# Patient Record
Sex: Female | Born: 1935 | Race: White | Hispanic: No | Marital: Single | Smoking: Former smoker
Health system: Southern US, Community
[De-identification: ages and names within clinical notes are randomized; demographics above are authoritative.]

## PROBLEM LIST (undated history)

## (undated) DIAGNOSIS — F32A Depression, unspecified: Secondary | ICD-10-CM

## (undated) DIAGNOSIS — I10 Essential (primary) hypertension: Secondary | ICD-10-CM

## (undated) DIAGNOSIS — M810 Age-related osteoporosis without current pathological fracture: Secondary | ICD-10-CM

## (undated) DIAGNOSIS — I679 Cerebrovascular disease, unspecified: Secondary | ICD-10-CM

## (undated) DIAGNOSIS — E785 Hyperlipidemia, unspecified: Secondary | ICD-10-CM

## (undated) DIAGNOSIS — E669 Obesity, unspecified: Secondary | ICD-10-CM

## (undated) DIAGNOSIS — Z0000 Encounter for general adult medical examination without abnormal findings: Secondary | ICD-10-CM

## (undated) DIAGNOSIS — F329 Major depressive disorder, single episode, unspecified: Secondary | ICD-10-CM

## (undated) DIAGNOSIS — I639 Cerebral infarction, unspecified: Secondary | ICD-10-CM

## (undated) DIAGNOSIS — B029 Zoster without complications: Secondary | ICD-10-CM

## (undated) DIAGNOSIS — J439 Emphysema, unspecified: Secondary | ICD-10-CM

## (undated) DIAGNOSIS — J449 Chronic obstructive pulmonary disease, unspecified: Secondary | ICD-10-CM

## (undated) HISTORY — DX: Age-related osteoporosis without current pathological fracture: M81.0

## (undated) HISTORY — DX: Encounter for general adult medical examination without abnormal findings: Z00.00

## (undated) HISTORY — DX: Zoster without complications: B02.9

## (undated) HISTORY — DX: Major depressive disorder, single episode, unspecified: F32.9

## (undated) HISTORY — DX: Hyperlipidemia, unspecified: E78.5

## (undated) HISTORY — DX: Cerebrovascular disease, unspecified: I67.9

## (undated) HISTORY — PX: TONSILLECTOMY: SUR1361

## (undated) HISTORY — DX: Depression, unspecified: F32.A

## (undated) HISTORY — DX: Emphysema, unspecified: J43.9

## (undated) HISTORY — DX: Obesity, unspecified: E66.9

---

## 2007-09-29 ENCOUNTER — Emergency Department (HOSPITAL_COMMUNITY): Admission: EM | Admit: 2007-09-29 | Discharge: 2007-09-29 | Payer: Self-pay | Admitting: *Deleted

## 2009-03-18 ENCOUNTER — Ambulatory Visit: Payer: Self-pay | Admitting: Vascular Surgery

## 2009-10-24 ENCOUNTER — Encounter: Payer: Self-pay | Admitting: Cardiovascular Disease

## 2009-12-01 ENCOUNTER — Encounter: Payer: Self-pay | Admitting: Cardiovascular Disease

## 2009-12-02 DIAGNOSIS — R0989 Other specified symptoms and signs involving the circulatory and respiratory systems: Secondary | ICD-10-CM | POA: Insufficient documentation

## 2009-12-02 DIAGNOSIS — R0789 Other chest pain: Secondary | ICD-10-CM | POA: Insufficient documentation

## 2009-12-02 DIAGNOSIS — I10 Essential (primary) hypertension: Secondary | ICD-10-CM | POA: Insufficient documentation

## 2009-12-02 DIAGNOSIS — M810 Age-related osteoporosis without current pathological fracture: Secondary | ICD-10-CM | POA: Insufficient documentation

## 2009-12-02 DIAGNOSIS — Z87891 Personal history of nicotine dependence: Secondary | ICD-10-CM | POA: Insufficient documentation

## 2009-12-02 DIAGNOSIS — I679 Cerebrovascular disease, unspecified: Secondary | ICD-10-CM | POA: Insufficient documentation

## 2009-12-02 DIAGNOSIS — R0609 Other forms of dyspnea: Secondary | ICD-10-CM

## 2009-12-02 DIAGNOSIS — J438 Other emphysema: Secondary | ICD-10-CM | POA: Insufficient documentation

## 2009-12-02 DIAGNOSIS — I2699 Other pulmonary embolism without acute cor pulmonale: Secondary | ICD-10-CM | POA: Insufficient documentation

## 2009-12-02 DIAGNOSIS — E559 Vitamin D deficiency, unspecified: Secondary | ICD-10-CM | POA: Insufficient documentation

## 2009-12-05 ENCOUNTER — Ambulatory Visit: Payer: Self-pay | Admitting: Cardiovascular Disease

## 2009-12-05 DIAGNOSIS — R072 Precordial pain: Secondary | ICD-10-CM | POA: Insufficient documentation

## 2009-12-27 ENCOUNTER — Encounter: Payer: Self-pay | Admitting: Cardiovascular Disease

## 2010-01-10 ENCOUNTER — Ambulatory Visit (HOSPITAL_COMMUNITY): Admission: RE | Admit: 2010-01-10 | Discharge: 2010-01-10 | Payer: Self-pay | Admitting: Cardiovascular Disease

## 2010-01-10 ENCOUNTER — Ambulatory Visit: Payer: Self-pay

## 2010-01-10 ENCOUNTER — Encounter: Payer: Self-pay | Admitting: Cardiovascular Disease

## 2010-01-10 ENCOUNTER — Ambulatory Visit: Payer: Self-pay | Admitting: Cardiology

## 2010-01-19 ENCOUNTER — Ambulatory Visit: Payer: Self-pay | Admitting: Cardiovascular Disease

## 2010-11-07 NOTE — Assessment & Plan Note (Signed)
Summary: f3w/jss   Visit Type:  3 weeks follow up Primary Provider:  Jarome Matin  CC:  BP was very high when she had the stress test.  History of Present Illness: 77 yo WF with history of long term tobacco abuse (stopped 1/11) , HTN, COPD here today for cardiac follow up. She began to notice chest tightness 2 months ago at the time of an upper respiratory infection. She describes a band-like tightness around her chest. No associated SOB, diaphoresis, nausea or vomiting. Occurs mostly when sitting around. Only lasts for a minute or two. She has been able to do yardwork and housework without any chest pressure. This seems to have resolved since her URI was treated. She feels back at her normal state of health. She does describe overall lack of energy and less exercise tolerance over the last few months. No near syncope or syncope.   She underwent a stress echo and this showed no ischemia. She did have a hypertensive response with exercise. She has no new complaints. No chest pain since she had her upper respiratory infection.   Current Medications (verified): 1)  Atenolol 50 Mg Tabs (Atenolol) .Marland Kitchen.. 1 Tab Once Daily..take 1 Extra Tab If Bp Elevated 2)  Diovan Hct 160-12.5 Mg Tabs (Valsartan-Hydrochlorothiazide) .Marland Kitchen.. 1 Tab Once Daily 3)  Norvasc 5 Mg Tabs (Amlodipine Besylate) .Marland Kitchen.. 1 Tab M, W, F 4)  Aspirin Ec 325 Mg Tbec (Aspirin) .... Take One Tablet By Mouth Daily 5)  Multivitamins   Tabs (Multiple Vitamin) .Marland Kitchen.. 1 Tab Once Daily 6)  Oscal 500/200 D-3 500-200 Mg-Unit Tabs (Calcium-Vitamin D) .Marland Kitchen.. 1 Tab Once Daily 7)  Plavix 75 Mg Tabs (Clopidogrel Bisulfate) .Marland Kitchen.. 1 Tab Once Daily 8)  Spiriva Handihaler 18 Mcg Caps (Tiotropium Bromide Monohydrate) .Marland Kitchen.. 1 Puff Once Daily 9)  Ventolin Hfa 108 (90 Base) Mcg/act Aers (Albuterol Sulfate) .... As Needed  Allergies (verified): No Known Drug Allergies  Past History:  Past Medical History: Reviewed history from 12/05/2009 and no changes  required. COPD, recent diagnosis 1/11. ESSENTIAL HYPERTENSION, BENIGN TOBACCO USE, former CVA 2007 OSTEOPOROSIS  VITAMIN D DEFICIENCY PE 1970 on Birth control pills.    Social History: Reviewed history from 12/05/2009 and no changes required. 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  1ppd for 50 years, quit Jan 2011. Alcohol Use -No Drug Use - No  Review of Systems  The patient denies fatigue, malaise, fever, weight gain/loss, vision loss, decreased hearing, hoarseness, chest pain, palpitations, shortness of breath, prolonged cough, wheezing, sleep apnea, coughing up blood, abdominal pain, blood in stool, nausea, vomiting, diarrhea, heartburn, incontinence, blood in urine, muscle weakness, joint pain, leg swelling, rash, skin lesions, headache, fainting, dizziness, depression, anxiety, enlarged lymph nodes, easy bruising or bleeding, and environmental allergies.    Vital Signs:  Patient profile:   77 year old female Height:      65 inches Weight:      156.50 pounds BMI:     26.14 Pulse rate:   60 / minute Pulse rhythm:   regular Resp:     18 per minute BP sitting:   160 / 90  (left arm) Cuff size:   large  Vitals Entered By: Vikki Ports (January 19, 2010 11:01 AM)  Physical Exam  General:  General: Well developed, well nourished, NAD HEENT: OP clear, mucus membranes moist Psychiatric: Mood and affect normal Neck: No JVD, no carotid bruits, no thyromegaly, no lymphadenopathy. Lungs:Clear bilaterally, no wheezes, rhonci, crackles CV: RRR  no murmurs, gallops rubs Abdomen: soft, NT, ND, BS present Extremities: No edema, pulses 2+.    Stress Echocardiogram  Procedure date:  01/10/2010  Findings:      Study Conclusions  - Stress ECG conclusions: There were no stress arrhythmias or   conduction abnormalities. The stress ECG was normal. - Staged echo: There was no echocardiographic evidence for   stress-induced  ischemia. Recommendations: Stress echocardiogram with no chest pain, no new ST changes and no stress-induced wall motion abnormalities. Note patient with poor exercise tolerance and hypertensive response.   +---------------------+---+-------------+-----------------+ Stage 1              [id number] (144)Fatigue, leg pain +---------------------+---+-------------+-----------------+ Immediate post stress125-------------None              +---------------------+---+-------------+-----------------+ Recovery; 1 min      92 198/80 (119) None              +---------------------+---+-------------+-----------------+ Recovery; 2 min      84 -------------None              +---------------------+---+-------------+-----------------+ Recovery; 3 min      82 -------------None              +-------------------  Impression & Recommendations:  Problem # 1:  CHEST PAIN, ATYPICAL (ICD-786.59) Most likely non-cardiac in etiology. Her stress echo was normal. She did have a hypertensive response to exercise. Her resting blood pressure is elevated today. I have discussed changing her medications by increasing her Norvasc to once daily but she does not wish to do this. She has a record of blood pressures from home and they are mostly less than 150 systolic. She will discuss with Dr. Eloise Harman at her follow up appt.  No further cardiac workup at this time.   Her updated medication list for this problem includes:    Atenolol 50 Mg Tabs (Atenolol) .Marland Kitchen... 1 tab once daily..take 1 extra tab if bp elevated    Norvasc 5 Mg Tabs (Amlodipine besylate) .Marland Kitchen... 1 tab m, w, f    Aspirin Ec 325 Mg Tbec (Aspirin) .Marland Kitchen... Take one tablet by mouth daily    Plavix 75 Mg Tabs (Clopidogrel bisulfate) .Marland Kitchen... 1 tab once daily  Patient Instructions: 1)  Your physician recommends that you schedule a follow-up appointment as needed.

## 2010-11-07 NOTE — Letter (Signed)
Summary: [facility] Office Note  [facility] Office Note   Imported By: Roderic Ovens 12/16/2009 16:33:09  _____________________________________________________________________  External Attachment:    Type:   Image     Comment:   External Document

## 2010-11-07 NOTE — Letter (Signed)
Summary: [facility] - Med List  [facility]   Imported By: Marylou Mccoy 01/19/2010 14:12:57  _____________________________________________________________________  External Attachment:    Type:   Image     Comment:   External Document

## 2010-11-07 NOTE — Assessment & Plan Note (Signed)
Summary: NP6/CHEST PAIN/JML   Visit Type:  new pt visit  CC:  occassional cp/pressure.  History of Present Illness: 77 yo WF with history of long term tobacco abuse, HTN, COPD here today for an initial cardiac evaluation. She began to notice chest tightness 2 months ago at the time of an upper respiratory infection. She describes a bank-like tightness around her chest. No associated SOB, diaphoresis, nausea or vomiting. Occurs mostly when sitting around. Only lasts for a minute or two. She has been able to do yardwork and housework without any chest pressure. This seems to have resolved since her URI was treated. She feels back at her normal state of health. She does describe overall lack of energy and less exercise tolerance over the last few months. No near syncope or syncope.   Current Medications (verified): 1)  Atenolol 50 Mg Tabs (Atenolol) .Marland Kitchen.. 1 Tab Once Daily..take 1 Extra Tab If Bp Elevated 2)  Diovan Hct 160-12.5 Mg Tabs (Valsartan-Hydrochlorothiazide) .Marland Kitchen.. 1 Tab Once Daily 3)  Norvasc 5 Mg Tabs (Amlodipine Besylate) .Marland Kitchen.. 1 Tab M, W, F 4)  Aspirin Ec 325 Mg Tbec (Aspirin) .... Take One Tablet By Mouth Daily 5)  Multivitamins   Tabs (Multiple Vitamin) .Marland Kitchen.. 1 Tab Once Daily 6)  Oscal 500/200 D-3 500-200 Mg-Unit Tabs (Calcium-Vitamin D) .Marland Kitchen.. 1 Tab Once Daily 7)  Plavix 75 Mg Tabs (Clopidogrel Bisulfate) .Marland Kitchen.. 1 Tab Once Daily 8)  Spiriva Handihaler 18 Mcg Caps (Tiotropium Bromide Monohydrate) .Marland Kitchen.. 1 Puff Once Daily 9)  Ventolin Hfa 108 (90 Base) Mcg/act Aers (Albuterol Sulfate) .... As Needed  Allergies (verified): No Known Drug Allergies  Past History:  Past Medical History: COPD, recent diagnosis 1/11. ESSENTIAL HYPERTENSION, BENIGN TOBACCO USE, former CVA 2007 OSTEOPOROSIS  VITAMIN D DEFICIENCY PE 1970 on Birth control pills.    Family History: Father died age 70, cancer Mother died age 29 with cancer, CVA No CAD  Social History: Therapist, nutritional Divorced , 1 child Tobacco Use - Former.  1ppd for 50 years, quit Jan 2011. Alcohol Use -No Drug Use - No  Review of Systems       The patient complains of fatigue and chest pain.  The patient denies malaise, fever, weight gain/loss, vision loss, decreased hearing, hoarseness, palpitations, shortness of breath, prolonged cough, wheezing, sleep apnea, coughing up blood, abdominal pain, blood in stool, nausea, vomiting, diarrhea, heartburn, incontinence, blood in urine, muscle weakness, joint pain, leg swelling, rash, skin lesions, headache, fainting, dizziness, depression, anxiety, enlarged lymph nodes, easy bruising or bleeding, and environmental allergies.    Vital Signs:  Patient profile:   77 year old female Height:      65 inches Weight:      149 pounds BMI:     24.88 Pulse rate:   63 / minute Pulse rhythm:   irregular BP sitting:   165 / 89  (left arm)  Vitals Entered By: Danielle Rankin, CMA (December 05, 2009 9:17 AM)  Physical Exam  General:  General: Well developed, well nourished, NAD HEENT: OP clear, mucus membranes moist SKIN: warm, dry Neuro: No focal deficits Musculoskeletal: Muscle strength 5/5 all ext Psychiatric: Mood and affect normal Neck: No JVD, no carotid bruits, no thyromegaly, no lymphadenopathy. Lungs:Clear bilaterally, no wheezes, rhonci, crackles CV: RRR no murmurs, gallops rubs Abdomen: soft, NT, ND, BS present Extremities: No edema, pulses 2+.    EKG  Procedure date:  12/05/2009  Findings:      NSR, rate of 63 bpm. Poor  R wave progression through the precordial leads.  Impression & Recommendations:  Problem # 1:  CHEST TIGHTNESS-PRESSURE-OTHER ([id number]) It is most likely that her chest pressure is related to her recent URI with underlying COPD. She does have several risk factors for CAD including long term tobacco abuse and HTN. Her EKG shows poor R wave progression through the precordial leads. Although her symptoms are mostly  atypical, I think it would be reasonable to proceed with an exercise stress echo to risk stratify. I will see her back to review in several weeks.   Other Orders: EKG w/ Interpretation (93000) Stress Echo (Stress Echo)  Patient Instructions: 1)  Your physician recommends that you schedule a follow-up appointment in: 3 weeks 2)  Your physician has requested that you have a stress echocardiogram. For further information please visit [email or web address]  Please follow instruction sheet as given.

## 2010-11-07 NOTE — Consult Note (Signed)
Summary: [facility]   Imported By: Marylou Mccoy 01/19/2010 14:26:42  _____________________________________________________________________  External Attachment:    Type:   Image     Comment:   External Document

## 2010-11-12 ENCOUNTER — Inpatient Hospital Stay (INDEPENDENT_AMBULATORY_CARE_PROVIDER_SITE_OTHER)
Admission: RE | Admit: 2010-11-12 | Discharge: 2010-11-12 | Disposition: A | Discharge status: Home / Self Care | Payer: Medicare Other | Source: Ambulatory Visit | Attending: Family Medicine | Admitting: Family Medicine

## 2010-11-12 ENCOUNTER — Emergency Department (HOSPITAL_COMMUNITY): Payer: Medicare Other

## 2010-11-12 ENCOUNTER — Emergency Department (HOSPITAL_COMMUNITY)
Admission: EM | Admit: 2010-11-12 | Discharge: 2010-11-13 | Disposition: A | Discharge status: Home / Self Care | Payer: Medicare Other | Attending: Emergency Medicine | Admitting: Emergency Medicine

## 2010-11-12 DIAGNOSIS — R42 Dizziness and giddiness: Secondary | ICD-10-CM | POA: Insufficient documentation

## 2010-11-12 DIAGNOSIS — I10 Essential (primary) hypertension: Secondary | ICD-10-CM

## 2010-11-12 DIAGNOSIS — Z8679 Personal history of other diseases of the circulatory system: Secondary | ICD-10-CM | POA: Insufficient documentation

## 2010-11-12 DIAGNOSIS — G9389 Other specified disorders of brain: Secondary | ICD-10-CM | POA: Insufficient documentation

## 2010-11-12 DIAGNOSIS — J438 Other emphysema: Secondary | ICD-10-CM | POA: Insufficient documentation

## 2010-11-12 LAB — CBC
HCT: 38.2 % (ref 36.0–46.0)
Hemoglobin: 12.8 g/dL (ref 12.0–15.0)
MCH: 30.4 pg (ref 26.0–34.0)
MCV: 90.7 fL (ref 78.0–100.0)
Platelets: 299 10*3/uL (ref 150–400)
RBC: 4.21 MIL/uL (ref 3.87–5.11)

## 2010-11-12 LAB — COMPREHENSIVE METABOLIC PANEL
AST: 27 U/L (ref 0–37)
Alkaline Phosphatase: 59 U/L (ref 39–117)
BUN: 11 mg/dL (ref 6–23)
CO2: 26 mEq/L (ref 19–32)
Calcium: 9.6 mg/dL (ref 8.4–10.5)
Creatinine, Ser: 0.75 mg/dL (ref 0.4–1.2)
GFR calc non Af Amer: >60 mL/min (ref >60)
Potassium: 4.4 mEq/L (ref 3.5–5.1)
Total Bilirubin: 0.2 mg/dL — ABNORMAL LOW (ref 0.3–1.2)

## 2010-11-12 LAB — DIFFERENTIAL
Basophils Absolute: 0 10*3/uL (ref 0.0–0.1)
Eosinophils Relative: 1 % (ref 0–5)
Lymphocytes Relative: 19 % (ref 12–46)
Lymphs Abs: 1.7 10*3/uL (ref 0.7–4.0)
Monocytes Relative: 8 % (ref 3–12)
Neutrophils Relative %: 72 % (ref 43–77)

## 2010-11-12 LAB — URINALYSIS, ROUTINE W REFLEX MICROSCOPIC
Ketones, ur: NEGATIVE mg/dL
Urine Glucose, Fasting: NEGATIVE mg/dL
Urobilinogen, UA: 0.2 mg/dL (ref 0.0–1.0)

## 2010-11-12 LAB — URINE MICROSCOPIC-ADD ON

## 2010-11-12 LAB — PROTIME-INR: INR: 0.91 (ref 0.00–1.49)

## 2010-11-12 LAB — APTT: aPTT: 26 seconds (ref 24–37)

## 2010-11-14 LAB — URINE CULTURE

## 2011-02-14 ENCOUNTER — Encounter (HOSPITAL_COMMUNITY): Payer: Self-pay | Admitting: Radiology

## 2011-02-14 ENCOUNTER — Emergency Department (HOSPITAL_COMMUNITY)
Admission: EM | Admit: 2011-02-14 | Discharge: 2011-02-14 | Disposition: A | Discharge status: Home / Self Care | Payer: Medicare Other | Attending: Emergency Medicine | Admitting: Emergency Medicine

## 2011-02-14 ENCOUNTER — Emergency Department (HOSPITAL_COMMUNITY): Payer: Medicare Other

## 2011-02-14 ENCOUNTER — Inpatient Hospital Stay (HOSPITAL_COMMUNITY)
Admission: EM | Admit: 2011-02-14 | Discharge: 2011-02-15 | DRG: 069 | Disposition: A | Discharge status: Home / Self Care | Payer: Medicare Other | Attending: Internal Medicine | Admitting: Internal Medicine

## 2011-02-14 DIAGNOSIS — R079 Chest pain, unspecified: Secondary | ICD-10-CM | POA: Insufficient documentation

## 2011-02-14 DIAGNOSIS — E559 Vitamin D deficiency, unspecified: Secondary | ICD-10-CM | POA: Diagnosis present

## 2011-02-14 DIAGNOSIS — J438 Other emphysema: Secondary | ICD-10-CM | POA: Diagnosis present

## 2011-02-14 DIAGNOSIS — I658 Occlusion and stenosis of other precerebral arteries: Secondary | ICD-10-CM | POA: Insufficient documentation

## 2011-02-14 DIAGNOSIS — Z7982 Long term (current) use of aspirin: Secondary | ICD-10-CM

## 2011-02-14 DIAGNOSIS — E785 Hyperlipidemia, unspecified: Secondary | ICD-10-CM | POA: Diagnosis present

## 2011-02-14 DIAGNOSIS — Z86711 Personal history of pulmonary embolism: Secondary | ICD-10-CM

## 2011-02-14 DIAGNOSIS — I672 Cerebral atherosclerosis: Secondary | ICD-10-CM | POA: Insufficient documentation

## 2011-02-14 DIAGNOSIS — I6529 Occlusion and stenosis of unspecified carotid artery: Secondary | ICD-10-CM | POA: Insufficient documentation

## 2011-02-14 DIAGNOSIS — Z8679 Personal history of other diseases of the circulatory system: Secondary | ICD-10-CM | POA: Insufficient documentation

## 2011-02-14 DIAGNOSIS — Z7902 Long term (current) use of antithrombotics/antiplatelets: Secondary | ICD-10-CM

## 2011-02-14 DIAGNOSIS — Z87891 Personal history of nicotine dependence: Secondary | ICD-10-CM

## 2011-02-14 DIAGNOSIS — Z8673 Personal history of transient ischemic attack (TIA), and cerebral infarction without residual deficits: Secondary | ICD-10-CM

## 2011-02-14 DIAGNOSIS — M810 Age-related osteoporosis without current pathological fracture: Secondary | ICD-10-CM | POA: Diagnosis present

## 2011-02-14 DIAGNOSIS — G458 Other transient cerebral ischemic attacks and related syndromes: Principal | ICD-10-CM | POA: Diagnosis present

## 2011-02-14 DIAGNOSIS — I10 Essential (primary) hypertension: Secondary | ICD-10-CM | POA: Diagnosis present

## 2011-02-14 DIAGNOSIS — R4789 Other speech disturbances: Secondary | ICD-10-CM | POA: Insufficient documentation

## 2011-02-14 DIAGNOSIS — G459 Transient cerebral ischemic attack, unspecified: Secondary | ICD-10-CM | POA: Insufficient documentation

## 2011-02-14 HISTORY — DX: Essential (primary) hypertension: I10

## 2011-02-14 LAB — COMPREHENSIVE METABOLIC PANEL
Albumin: 3.7 g/dL (ref 3.5–5.2)
Alkaline Phosphatase: 67 U/L (ref 39–117)
BUN: 11 mg/dL (ref 6–23)
Chloride: 100 mEq/L (ref 96–112)
Creatinine, Ser: 0.6 mg/dL (ref 0.4–1.2)
GFR calc non Af Amer: >60 mL/min (ref >60)
Total Bilirubin: 0.3 mg/dL (ref 0.3–1.2)
Total Protein: 7.3 g/dL (ref 6.0–8.3)

## 2011-02-14 LAB — POCT I-STAT 3, VENOUS BLOOD GAS (G3P V)
Acid-Base Excess: 4 mmol/L — ABNORMAL HIGH (ref 0.0–2.0)
Bicarbonate: 31 mEq/L — ABNORMAL HIGH (ref 20.0–24.0)
pCO2, Ven: 54.2 mmHg — ABNORMAL HIGH (ref 45.0–50.0)
pH, Ven: 7.366 — ABNORMAL HIGH (ref 7.250–7.300)
pO2, Ven: 15 mmHg — CL (ref 30.0–45.0)

## 2011-02-14 LAB — URINALYSIS, ROUTINE W REFLEX MICROSCOPIC
Bilirubin Urine: NEGATIVE
Glucose, UA: NEGATIVE mg/dL
Hgb urine dipstick: NEGATIVE
Ketones, ur: NEGATIVE mg/dL
Nitrite: NEGATIVE
Specific Gravity, Urine: 1.013 (ref 1.005–1.030)

## 2011-02-14 LAB — CBC
HCT: 37.7 % (ref 36.0–46.0)
MCHC: 35 g/dL (ref 30.0–36.0)
MCV: 89.3 fL (ref 78.0–100.0)
RDW: 13.2 % (ref 11.5–15.5)
WBC: 6.6 10*3/uL (ref 4.0–10.5)

## 2011-02-14 LAB — DIFFERENTIAL
Eosinophils Absolute: 0.1 10*3/uL (ref 0.0–0.7)
Lymphocytes Relative: 22 % (ref 12–46)
Lymphs Abs: 1.5 10*3/uL (ref 0.7–4.0)
Neutro Abs: 4.4 10*3/uL (ref 1.7–7.7)

## 2011-02-14 LAB — URINE MICROSCOPIC-ADD ON

## 2011-02-14 LAB — POCT CARDIAC MARKERS

## 2011-02-14 LAB — LIPID PANEL
Cholesterol: 168 mg/dL (ref 0–200)
HDL: 61 mg/dL (ref >39)
Total CHOL/HDL Ratio: 2.8 RATIO
Triglycerides: 90 mg/dL (ref <150)
VLDL: 18 mg/dL (ref 0–40)

## 2011-02-14 LAB — PROTIME-INR
INR: 0.95 (ref 0.00–1.49)
Prothrombin Time: 12.9 seconds (ref 11.6–15.2)

## 2011-02-14 LAB — APTT: aPTT: 27 seconds (ref 24–37)

## 2011-02-15 NOTE — Consult Note (Signed)
NAMECHLOIE, LONEY NO.:  [id number]  MEDICAL RECORD NO.:  [id number]           PATIENT TYPE:  E  LOCATION:  [facility]                         FACILITY:  [facility]  PHYSICIAN:  Thana Farr, MD    DATE OF BIRTH:  [birth year]  DATE OF CONSULTATION: DATE OF DISCHARGE:                                CONSULTATION   CHIEF COMPLAINT:  Generalized weakness associated with seeing squiggly lines and difficulty with speaking.  HISTORY OF PRESENT ILLNESS:  The patient is a 77 year old female who presents to [facility] ED with main concern of seeing squiggly lines associated with generalized weakness.  This started last night.  The patient checked her blood pressure at that time and was 205/130.  She took blood pressure medications, went to bed, and woke up around 4 a.m. At that time, her blood pressure again was elevated over 180/100 and she was seeing squiggly lines again.  Once she took blood pressure medications, her blood pressure was normalized and her vision resolved. She went to ER because she was concerned for possible stroke given her history of stroke 6 years ago and friend that lives with her who recently had a stroke as well.  The patient was discharged from ER and came back because she had an episode of recurrent slurred speech.  The patient reports feeling better at this time.  Also reports her slurred speech and blurry vision is resolved at this point.  PAST MEDICAL HISTORY:  Stroke 6 years ago, hypertension and emphysema.  MEDICATIONS:  Aspirin, atenolol, Centrum and Diovan.  ALLERGIES:  NKDA.  FAMILY HISTORY:  Noncontributory.  SOCIAL HISTORY:  Lives at home in [place] alone.  No alcohol, tobacco or drug use.  REVIEW OF SYSTEMS:  Generalized weakness associated with slurred speech, otherwise negative.  PHYSICAL EXAMINATION:  VITAL SIGNS:  Temperature 98.7, blood pressure 213/76, heart rate 50-65, respirations 12, oxygen saturation 99. HEENT:   Atraumatic, PERRLA. NECK:  Supple.  Full range of motion. LUNGS:  Clear to auscultation bilaterally.  No wheezing. CARDIOVASCULAR:  Bradycardiac, sinus rhythm, distant heart sounds, S1, S2 present.  No murmurs appreciated. ABDOMEN:  Soft, nontender, nondistended.  Bowel sounds present. SKIN:  Mucosa moist with good turgor. NEURO:  Mental Status-The patient is alert and oriented x3. Speech is fluent without aphasia.  Follow commands withoult difficulty.  Cranial Nerves-II: Discs flat bilaterally.  Visual fields full. III/IV/VI: PERRLA, EOMI without nystagmus. V/VII:  Decreased right NLF. VIII: grossly intact IX/X: positive gag XII: midline tongue extension Motor: strength is 5/5 throughout. Normal tone and bulk.  Sensation: intact. Cerebellar: normal heel-to-shin and finger-to-nose bilaterally. DTR +2 throughout.  Plantars mute bilaterally.  TEST RESULTS:  Sodium 136, potassium 3.6, chloride 100, bicarb 27, BUN 11, creatinine 0.60, glucose 99.  WBC 6.6, hemoglobin 13.2, hematocrit 37.7, platelets 310.  PT/INR, PTT pending.  Urinalysis negative except trace leukocytes.  CT negative for acute infarction.  MRI negative for acute infarction.  ASSESSMENT:  The patient is a 77 year old female who presents with visual changes and speech deficits felt to be secondary to high blood pressure. Patient with risk factors  of hypertension.  The patient has CT and MRI negative for acute stroke.  Symptoms have now resolved therefore she is not a tPA candidate.  Although she has had a good portion of the stroke work up this will need to be completed and she will need observation for continuing symptoms.  PLAN: 1. CTA of the neck to rule out large vessel occlusion. 2. 2D echo  3. Fasting, lipid profile and hemoglobin A1c. 4. Continue Plavix and aspirin    If above workup is negative, no further neurologic workup is recommended.    Over 30 minutes was spent on consulting the patient.           ______________________________ Thana Farr, MD     LR/MEDQ  D:  02/14/2011  T:  02/14/2011  Job:  [id number]  Electronically Signed by Thana Farr MD on 02/15/2011 08:46:39 AM

## 2011-02-20 NOTE — H&P (Signed)
[name], Graves NO.:  [id number]  MEDICAL RECORD NO.:  [id number]           PATIENT TYPE:  I  LOCATION:  3033                         FACILITY:  [facility]  PHYSICIAN:  Barry Dienes. Eloise Harman, M.D.DATE OF BIRTH:  16-Aug-1933  DATE OF ADMISSION:  02/14/2011 DATE OF DISCHARGE:                             HISTORY & PHYSICAL   CHIEF COMPLAINT:  Transient speech difficulties and vision changes.  HISTORY OF PRESENT ILLNESS:  The patient is a 77 year old Caucasian woman with several medical problems who was in her usual state of reasonable health until yesterday when she started having intermittent lines in the vision on both sides.  She checked her home blood pressure which was greater than 200 systolic and took an extra dose of atenolol. Her symptoms resolved initially.  Today at approximately 4:15 a.m. when she awoke, she continued to have squiggly lines in both sides of her visual field and checked her blood pressure, which was 179/100.  She presented to the emergency room for evaluation.  In the emergency room, she had a full evaluation including labs, EKG, chest x-ray, CT scan of the head without contrast, MRI of the brain, and MRA of the brain.  The test did not show an acute infarct and her symptoms resolved within a short period of time.  When she presented to the emergency room, she had also had some slurred speech and dysarthria with word-finding difficulty.  She was discharged from the emergency room at approximately 1:30 p.m. with a plan for a follow up with a neurologist within the next few days.  Unfortunately, she developed dysarthria within 30 minutes of the emergency room discharge and was readmitted for evaluation.  She had dysarthria with some word-finding difficulty that lasted about 10 minutes and was readmitted for evaluation.  She had no other significant symptoms with no facial droop, weakness, headache, vomiting, double vision, fever, or chills,  and she was admitted for further evaluation.  PAST MEDICAL HISTORY:  Bilateral cerebrovascular disease with 2010 carotid ultrasound showing less than 40% stenosis of the internal carotid arteries, hypertension, essential, benign and labile, emphysema with long history of cigarette smoking discontinued in January 2011.  In 1970, she had pulmonary embolism secondary to the use of oral contraceptive pills.  In April 2011, she had atypical chest pain with the stress echocardiogram showing no evidence of ischemia.  In 2011, she had weight gain which was exogenous and associated with stopping smoking.  In February 2012, she had a CT scan that showed an old right caudate nucleus infarct that was asymptomatic.  She also has a history of osteoporosis with 2006 bone mineral density test showing a T score of -2.9 and labs at that time showing borderline vitamin D deficiency.  MEDICATIONS PRIOR TO EMERGENCY ROOM EVALUATION: 1. Aspirin 325 mg once daily. 2. Atenolol 50 mg once daily. 3. Multivitamin one tablet p.o. daily. 4. Os-Cal D 500 once daily. 5. Diovan HCT 160/25 one tablet p.o. q.a.m. and note that she had     stopped Plavix in February 2012 due to its high cost and relatively  low added benefit for strokes and TIAs versus aspirin.  ALLERGIES:  No known drug allergies.  PAST SURGICAL HISTORY:  Remote tonsillectomy.  SOCIAL HISTORY:  She has been divorced for over 30 years.  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  She has a history of tobacco use of one and one half packs per day for many years, but stopped in January 2011.  She has no history of alcohol abuse.  FAMILY HISTORY:  Her father died from lung cancer.  Her mother died at age 63 from stroke.  There is a family history of close relatives with diabetes mellitus and a son with diabetes mellitus, type 1.  REVIEW OF SYSTEMS:  Earlier today, she had blurry vision as described above and currently her vision is  normal.  She has not had recent fever, chills, headaches, shortness of breath, urinary incontinence, arthralgias, weakness, difficulty walking, or dysphagia.  She has occasional very brief twinges of chest discomfort and indigestion and GERD symptoms.  CURRENT PHYSICAL EXAMINATION:  VITAL SIGNS:  Blood pressure 189/79, pulse 56, respirations 17, temperature 98.5, pulse oxygen saturation was 95% on 2 liters per minute of nasal cannula oxygen. GENERAL:  She is an elderly white woman who is in no apparent distress while sitting upright on an emergency room gurney. HEAD, EYES, EARS, NOSE AND THROAT:  Within normal limits and she did not have a facial droop. NECK:  Supple and without jugular venous distention or carotid bruit. CHEST:  Clear to auscultation. HEART:  Had a regular rate and rhythm without murmur or gallop. ABDOMEN:  Had normal bowel sounds and no hepatosplenomegaly or tenderness. EXTREMITIES:  Without cyanosis, clubbing, or edema and the pedal pulses were normal bilaterally. NEUROLOGIC:  She is alert and well oriented with a normal affect. Cranial nerves II through XII were significant for very mildly decreased hearing bilaterally.  Sensory exam was grossly normal, motor strength was 5/5 throughout.  She had normal bilateral finger-to-nose testing. Gait assessment was deferred at this time.  LABORATORY STUDIES:  Serum sodium 136, potassium 3.6, chloride 100, carbon dioxide 27, BUN 11, creatinine 0.60, glucose 99.  Urinalysis was normal.  Liver associated enzymes were normal.  White blood cell count 6.6, hemoglobin 13, hematocrit 37.7, platelets 310.  Total cholesterol was 168, triglycerides 90, HDL cholesterol 61, LDL cholesterol 89.  EKG showed a normal sinus rhythm on telemetry with results of 12-lead EKG study pending at the time of dictation.  MRI of the brain showed no evidence of an acute infarct.  There was left frontoparietal cortical hyperintensity consistent with  a small chronic infarct and negative for hemorrhage, mass, or edema.  The MRA of the brain showed both vertical arteries patent to the basilar artery.  The pica, superior cerebellar, and posterior cerebral arteries were patent bilaterally.  There was moderate disease in the mid and distal right posterior cerebral artery and mild disease in the left posterior cerebral artery.  The carotid arteries were patent bilaterally with mild atherosclerotic disease in the cavernous carotids bilaterally.  There was irregularity in the anterior and middle cerebral arteries bilaterally compatible with atherosclerotic disease.  There was mild-to- moderate stenosis in the right A1 segment with scattered atherosclerotic narrowing in the middle cerebral arteries bilaterally.  There was no large vessel occlusion or aneurysm.  The finding of the study was consistent with moderately advanced intracranial atherosclerotic disease.  A CT scan of the head without IV contrast did not show definite evidence of an old infarcts  in the right caudate nucleus with no evidence of an acute infarct.  A chest x-ray showed no acute findings in one view.  IMPRESSION AND PLAN: 1. Transient ischemic attack:  The patient had transient dysarthria     and word-finding difficulty.  It is likely from atherosclerotic     disease in the right posterior cerebellar artery distribution.  She     has evidence of diffuse intracranial atherosclerosis.  There is no     large vessel disease and no evidence of an acute stroke.  Although     earlier this year, we had switched from Plavix to aspirin because     of cost concerns.  We will switch back to Plavix 75 mg daily given     her very high risk of stroke.  We will complete her stroke workup,     which would include a carotid ultrasound exam and an     echocardiogram.  She will also have overnight telemetry to rule out     the unlikely possibility of an intermittent arrhythmia such as      atrial fibrillation that would change her management. 2. Hypertension:  Her blood pressure was moderately elevated upon     arrival and now is in an acceptable range for an acute transient     ischemic attack.  We will add low-dose amlodipine at 2.5 mg daily     with a plan for slowly achieving more acceptable long-term blood     pressure readings. 3. Hyperlipidemia:  She has very slightly elevated LDL cholesterol.     However guidelines would recommend that we strive to keep her LDL     cholesterol less than 70, so we will add pravastatin at 20 mg     daily. 4. Emphysema:  Stable on current medications.  She has done quite well     with abstinence from smoking with a rapid improvement in her     pulmonary function and symptomatology.          ______________________________ Barry Dienes Eloise Harman, M.D.     DGP/MEDQ  D:  02/14/2011  T:  02/15/2011  Job:  [id number]  Electronically Signed by Jarome Matin M.D. on 02/20/2011 10:46:49 AM

## 2011-02-20 NOTE — Procedures (Signed)
CAROTID DUPLEX EXAM   INDICATION:  Dizziness, TIA.   HISTORY:  Diabetes:  No.  Cardiac:  No.  Hypertension:  Yes.  Smoking:  Yes.  Previous Surgery:  No.  CV History:  Light stroke about 4 years ago.  Amaurosis Fugax No, Paresthesias No, Hemiparesis No.                                       RIGHT             LEFT  Brachial systolic pressure:         180               158  Brachial Doppler waveforms:         Biphasic          Biphasic  Vertebral direction of flow:        Antegrade         Antegrade  DUPLEX VELOCITIES (cm/sec)  CCA peak systolic                   110               107  ECA peak systolic                   120               99  ICA peak systolic                   80                121  ICA end diastolic                   26                37  PLAQUE MORPHOLOGY:                  Homogenous        Heterogenous  PLAQUE AMOUNT:                      Mild              Mild  PLAQUE LOCATION:                    BIF, ICA          BIF, ICA   IMPRESSION:  Bilateral 20-39% internal carotid artery stenosis.   ___________________________________________  Quita Skye Hart [place], M.D.   AC/MEDQ  D:  03/18/2009  T:  03/18/2009  Job:  [id number]

## 2011-02-20 NOTE — Discharge Summary (Signed)
Sarah Graves, Sarah Graves NO.:  [id number]  MEDICAL RECORD NO.:  [id number]           PATIENT TYPE:  I  LOCATION:  3033                         FACILITY:  [facility]  PHYSICIAN:  Barry Dienes. Eloise Harman, M.D.DATE OF BIRTH:  October 19, 1932  DATE OF ADMISSION:  02/14/2011 DATE OF DISCHARGE:  02/15/2011                              DISCHARGE SUMMARY   PERTINENT FINDINGS:  The patient is a 77 year old Caucasian woman with several medical problems who was in her usual state of reasonable health until the day prior to admission when she started having intermittent lines in her vision on both sides.  She checked her home blood pressure readings which were greater than 200 systolic, so she took an extra dose of atenolol.  Her symptoms resolved initially.  On the day of admission at approximately 4:15 a.m., she awoke and continued to have squiggly lines in both sides of her visual fields.  She rechecked her blood pressure which was 179/100 and she presented to the emergency room for evaluation.  In the emergency room, she had a full evaluation including labs, EKG, chest x-ray, CT scan of the head without contrast, MRI of the brain, and MRA of the brain.  The evaluation did not show an acute infarct and her symptoms resolved within a short period of time, so she was discharged from the emergency room at approximately 1:30 p.m.  There was a plan for followup with a neurologist within the next few days. Unfortunately, she developed dysarthria within 30 minutes of the emergency room discharge and was readmitted for evaluation.  She had the dysarthria and word-finding difficulty that lasted about 10 minutes. She had no other significant symptoms and she did not have a facial droop, weakness, headache, vomiting, double vision, fever, or chills.  PAST MEDICAL HISTORY:  Bilateral cerebrovascular disease with 2010 carotid ultrasound exam showing less than 40% stenosis of the internal carotid  arteries, hypertension, benign and labile, emphysema with a long history of cigarette smoking that was stopped in January 2011, 1970 pulmonary embolism secondary to the use of oral contraceptive pills, April 2011 atypical chest pain with a stress echocardiogram showing no evidence of ischemia, 2011 exogenous weight gain associated with stopping smoking.  In February 2012, she had a CT scan of the brain that showed an old right caudate nucleus infarct that was asymptomatic.  She also has a history of osteoporosis with 2006 bone mineral density test showing T-score of -2.9 and labs at that time showing borderline vitamin D deficiency.  MEDICATIONS PRIOR TO ADMISSION: 1. Ecotrin 325 mg p.o. daily. 2. Atenolol 50 mg p.o. daily. 3. Multivitamin 1 tablet p.o. daily. 4. Os-Cal D 500 once daily. 5. Diovan HCT 160/25 one tablet p.o. q.a.m. and note that she had     stopped Plavix in February 2012 due to its high cost and relatively     low added benefit for stroke prevention. 6. She was also on Spiriva 80 mcg inhalation once daily.  ALLERGIES:  No known drug allergies  PAST SURGICAL HISTORY:  Remote tonsillectomy.  See admission history and physical for details of social  history, family history, and review of systems.  INITIAL PHYSICAL EXAMINATION:  VITAL SIGNS:  Blood pressure 189/79, pulse 56, respirations 17, temperature 98.5, and pulse oxygen saturation 95% on 2 liters per minute of nasal cannula oxygen. GENERAL:  She is an elderly white woman who is in no apparent distress while sitting upright on an emergency room gurney. HEAD, EYES, EARS, NOSE, AND THROAT:  Within normal limits and she did not have a facial droop. NECK:  Supple without jugular venous distention or carotid bruit. CHEST:  Clear to auscultation. HEART:  Regular rate and rhythm and was without murmur or gallop. ABDOMEN:  Normal bowel sounds and no hepatosplenomegaly or tenderness. EXTREMITIES:  Without cyanosis,  clubbing, or edema and the pedal pulses were normal bilaterally. NEUROLOGIC:  She is alert and well oriented with a normal affect. Cranial nerves II-XII were significant for a very mildly decreased hearing bilaterally.  Sensory exam was grossly normal.  Motor strength was 5/5 throughout.  She had bilateral finger-to-nose testing that was normal.  Gait was not assessed in the emergency room.  INITIAL LABORATORY STUDIES:  Serum sodium 136, potassium 3.6, chloride 100, carbon dioxide 27, BUN 11, creatinine 0.60, and glucose 99. Urinalysis normal.  Liver-associated enzymes normal.  White blood cell count 6.6, hemoglobin 13, hematocrit 37.7, and platelets 310.  Total cholesterol 168, triglycerides 90, HDL cholesterol 61, and LDL cholesterol 89.  EKG showed a normal sinus rhythm on telemetry.  HOSPITAL COURSE:  The patient was admitted to a medical bed with telemetry.  She had an initial swallow evaluation in the emergency room that showed no dysphagia and she tolerated a normal diet without difficulty.  She had an extensive workup for TIAs that included a transthoracic echocardiogram that showed left ventricular ejection fraction of 60% with no evidence of valvular heart disease.  A carotid ultrasound was done with results pending at the time of dictation.  A single-view chest x-ray showed no acute cardiopulmonary disease.  A CT scan of the head without IV contrast showed mild cortical atrophy with no evidence of an old infarct in the right caudate nucleus as described on exam and no evidence for acute infarct, hemorrhage, or mass lesion. An MRI of the brain showed no evidence of an acute infarct.  There was mild chronic microvascular ischemia in the white matter.  There was left frontoparietal cortical hyperintensity, likely representing a small chronic infarct.  There was no evidence of hemorrhage.  An MRA of the head without contrast showed moderately advanced  intracranial atherosclerotic disease with bilateral patent carotid arteries, irregularity in the anterior and middle cerebral arteries bilaterally compatible with atherosclerotic disease.  Mild to moderate stenosis in the right A1 segment.  No large vessel occlusion and or aneurysm.  EKG telemetry showed no evidence of an arrhythmia.  She remained in a sinus rhythm throughout her stay.  COMPLICATIONS:  None.  CONDITION ON DISCHARGE:  Most recent physical exam shows: VITAL SIGNS:  Blood pressure 174/81, pulse 57, respirations 17, temperature 98.1, and pulse oxygen saturation was 95% on room air. GENERAL:  She is a mildly overweight white woman who is in no apparent distress, was sitting upright in bed. HEAD, EYES, EARS, NOSE, AND THROAT:  Within normal limits and there was no facial droop. NECK:  Without jugular venous distention or carotid bruit. CHEST:  Clear to auscultation. HEART:  Regular rate and rhythm without murmur. EXTREMITIES:  Without cyanosis, clubbing, or edema. NEUROLOGICAL:  She is alert and well oriented with a normal  affect. Cranial nerves II-XII were significant for mildly decreased hearing bilaterally.  She had intact finger-to-nose testing.  She had normal bilateral strength.  Her gait was normal.  DISCHARGE DIAGNOSES: 1. Transient ischemic attack. 2. Hypertension, essential, labile. 3. Hyperlipidemia, mild. 4. Emphysema. 5. Osteoporosis. 6. Bilateral mild internal carotid artery stenoses. 7. History of tobacco abuse until January 2011. 8. History of 1970 pulmonary embolism. 9. April 2011 atypical chest pain with stress echocardiogram normal. 10.Vitamin D deficiency.  DISCHARGE MEDICATIONS: 1. Amlodipine 2.5 mg p.o. daily. 2. Plavix 75 mg p.o. daily. 3. Pravastatin 20 mg p.o. daily. 4. Ecotrin 325 mg p.o. daily. 5. Atenolol 50 mg p.o. twice daily. 6. Diovan HCT 160/12.5 one tablet p.o. daily. 7. Multivitamin 1 tablet p.o. daily. 8. Os-Cal D 500 one  tablet p.o. daily. 9. Spiriva 18 mcg inhalation once daily p.r.n.  DISPOSITION AND FOLLOWUP:  The patient will be discharged to home today. She should have a followup appointment with Dr. Jarome Matin in approximately 2 weeks following discharge.  She should call Dr. Eloise Harman in the interim if there are any recurrence of concerning neurologic symptoms.  Please note that the process of discharge took 35 minutes.          ______________________________ Barry Dienes Eloise Harman, M.D.     DGP/MEDQ  D:  02/15/2011  T:  02/16/2011  Job:  [id number]  cc:   Thana Farr, MD  Electronically Signed by Jarome Matin M.D. on 02/20/2011 10:46:51 AM

## 2011-12-10 ENCOUNTER — Encounter: Payer: Self-pay | Admitting: Gastroenterology

## 2013-02-11 ENCOUNTER — Emergency Department (HOSPITAL_COMMUNITY): Payer: Medicare Other

## 2013-02-11 ENCOUNTER — Encounter (HOSPITAL_COMMUNITY): Payer: Self-pay | Admitting: Emergency Medicine

## 2013-02-11 ENCOUNTER — Observation Stay (HOSPITAL_COMMUNITY)
Admission: EM | Admit: 2013-02-11 | Discharge: 2013-02-12 | Disposition: A | Discharge status: Home / Self Care | Payer: Medicare Other | Attending: Internal Medicine | Admitting: Internal Medicine

## 2013-02-11 DIAGNOSIS — E785 Hyperlipidemia, unspecified: Secondary | ICD-10-CM | POA: Diagnosis present

## 2013-02-11 DIAGNOSIS — R29898 Other symptoms and signs involving the musculoskeletal system: Secondary | ICD-10-CM | POA: Insufficient documentation

## 2013-02-11 DIAGNOSIS — G459 Transient cerebral ischemic attack, unspecified: Principal | ICD-10-CM | POA: Diagnosis present

## 2013-02-11 DIAGNOSIS — J438 Other emphysema: Secondary | ICD-10-CM | POA: Diagnosis present

## 2013-02-11 DIAGNOSIS — I10 Essential (primary) hypertension: Secondary | ICD-10-CM | POA: Diagnosis present

## 2013-02-11 DIAGNOSIS — Z8673 Personal history of transient ischemic attack (TIA), and cerebral infarction without residual deficits: Secondary | ICD-10-CM | POA: Insufficient documentation

## 2013-02-11 DIAGNOSIS — R209 Unspecified disturbances of skin sensation: Secondary | ICD-10-CM | POA: Insufficient documentation

## 2013-02-11 DIAGNOSIS — Z87891 Personal history of nicotine dependence: Secondary | ICD-10-CM | POA: Insufficient documentation

## 2013-02-11 LAB — GLUCOSE, CAPILLARY: Glucose-Capillary: 80 mg/dL (ref 70–99)

## 2013-02-11 LAB — POCT I-STAT, CHEM 8
BUN: 13 mg/dL (ref 6–23)
Calcium, Ion: 1.18 mmol/L (ref 1.13–1.30)
Chloride: 103 mEq/L (ref 96–112)
Creatinine, Ser: 0.8 mg/dL (ref 0.50–1.10)
Glucose, Bld: 96 mg/dL (ref 70–99)
TCO2: 29 mmol/L (ref 0–100)

## 2013-02-11 LAB — CBC
HCT: 34.1 % — ABNORMAL LOW (ref 36.0–46.0)
Hemoglobin: 12.3 g/dL (ref 12.0–15.0)
MCH: 31.9 pg (ref 26.0–34.0)
MCV: 88.3 fL (ref 78.0–100.0)
RBC: 3.86 MIL/uL — ABNORMAL LOW (ref 3.87–5.11)

## 2013-02-11 LAB — TROPONIN I: Troponin I: <0.3 ng/mL (ref <0.30)

## 2013-02-11 LAB — DIFFERENTIAL
Eosinophils Absolute: 0.2 10*3/uL (ref 0.0–0.7)
Eosinophils Relative: 4 % (ref 0–5)
Lymphs Abs: 1.9 10*3/uL (ref 0.7–4.0)
Monocytes Relative: 12 % (ref 3–12)

## 2013-02-11 NOTE — ED Notes (Addendum)
Pt reports numbness and weakness to R leg that started at 2pm while planting flowers.  States R side of face numbness x 2 hours. Blood pressure elevated at home.  Speech is clear.  No neuro deficits noted on triage exam.  Pt straight to treatment room on arrival.

## 2013-02-11 NOTE — ED Provider Notes (Signed)
History     CSN: [id number]  Arrival date & time 02/11/13  2209   First MD Initiated Contact with Patient 02/11/13 2301      Chief Complaint  Patient presents with  . Numbness  . Extremity Weakness    Patient is a 79 y.o. female presenting with weakness. The history is provided by the patient.  Weakness This is a new problem. The current episode started 6 to 12 hours ago. The problem occurs constantly. The problem has been gradually improving. Pertinent negatives include no chest pain, no abdominal pain, no headaches and no shortness of breath. Nothing aggravates the symptoms. Relieved by: nothing tried. She has tried rest for the symptoms. The treatment provided mild relief.  pt reports she was planting flowers at 2pm today when she noted right leg numbness and later noted right facial numbness.  She also reports weakness to right LE as well.   She reports her symptoms are improved She also felt she had slurred speech earlier that is improved She had otherwise been well prior to this episode She reports h/o TIA in the past  Past Medical History  Diagnosis Date  . Hypertension     History reviewed. No pertinent past surgical history.  No family history on file.  History  Substance Use Topics  . Smoking status: Never Smoker   . Smokeless tobacco: Not on file  . Alcohol Use: No    OB History   Grav Para Term Preterm Abortions TAB SAB Ect Mult Living                  Review of Systems  Constitutional: Negative for fever.  HENT: Negative for neck pain.   Respiratory: Negative for shortness of breath.   Cardiovascular: Negative for chest pain.  Gastrointestinal: Negative for vomiting and abdominal pain.  Musculoskeletal: Negative for back pain.  Neurological: Positive for speech difficulty, weakness and numbness. Negative for syncope and headaches.  All other systems reviewed and are negative.    Allergies  Review of patient's allergies indicates no known  allergies.  Home Medications   Current Outpatient Rx  Name  Route  Sig  Dispense  Refill  . amLODipine (NORVASC) 2.5 MG tablet   Oral   Take 2.5 mg by mouth daily.         Marland Kitchen aspirin 325 MG tablet   Oral   Take 325 mg by mouth daily.         Marland Kitchen atenolol (TENORMIN) 50 MG tablet   Oral   Take 50 mg by mouth daily. May take additional dose if BP is high         . clopidogrel (PLAVIX) 75 MG tablet   Oral   Take 75 mg by mouth daily.         . Multiple Vitamin (MULTIVITAMIN WITH MINERALS) TABS   Oral   Take 1 tablet by mouth daily.         . pravastatin (PRAVACHOL) 20 MG tablet   Oral   Take 20 mg by mouth daily.         . valsartan-hydrochlorothiazide (DIOVAN-HCT) 160-25 MG per tablet   Oral   Take 1 tablet by mouth daily.           BP 187/80  Pulse 62  Temp(Src) 98.9 F (37.2 C) (Oral)  Resp 21  SpO2 96%  Physical Exam CONSTITUTIONAL: Well developed/well nourished HEAD: Normocephalic/atraumatic EYES: EOMI/PERRL, no nystagmus ENMT: Mucous membranes moist NECK: supple no meningeal  signs, no bruits SPINE:entire spine nontender CV: S1/S2 noted, no murmurs/rubs/gallops noted LUNGS: Lungs are clear to auscultation bilaterally, no apparent distress ABDOMEN: soft, nontender, no rebound or guarding GU:no cva tenderness NEURO:Awake/alert, facies symmetric, no arm or leg drift is noted She reports right facial numbness and right thigh numbness No other focal motor or sensory deficits NIHSS - 1 EXTREMITIES: pulses normal, full ROM SKIN: warm, color normal PSYCH: no abnormalities of mood noted   ED Course  Procedures  Labs Reviewed  CBC - Abnormal; Notable for the following:    RBC 3.86 (*)    HCT 34.1 (*)    MCHC 36.1 (*)    All other components within normal limits  DIFFERENTIAL  PROTIME-INR  APTT  COMPREHENSIVE METABOLIC PANEL  TROPONIN I  POCT I-STAT TROPONIN I   tPA in stroke considered but not given due to:  Onset over  3-4.5hours Symptoms are improving on presentation (time of onset approximately 2pm) 12:40 AM Pt now reports all of her symptoms have resolved Likely TIA, with high ABCD2 score Will call her PCP For admission 12:46 AM D/w dr Eric Form who is on call for dan paterson Will admit to tele obs for TIA  MDM  Nursing notes including past medical history and social history reviewed and considered in documentation Labs/vital reviewed and considered        Date: 02/11/2013  Rate: 64  Rhythm: normal sinus rhythm  QRS Axis: normal  Intervals: normal  ST/T Wave abnormalities: nonspecific ST changes  Conduction Disutrbances:none  Narrative Interpretation:   Old EKG Reviewed: unchanged    Joya Gaskins, MD 02/12/13 [phone]

## 2013-02-12 ENCOUNTER — Observation Stay (HOSPITAL_COMMUNITY): Payer: Medicare Other

## 2013-02-12 ENCOUNTER — Encounter (HOSPITAL_COMMUNITY): Payer: Self-pay | Admitting: Internal Medicine

## 2013-02-12 DIAGNOSIS — E785 Hyperlipidemia, unspecified: Secondary | ICD-10-CM | POA: Diagnosis present

## 2013-02-12 DIAGNOSIS — G459 Transient cerebral ischemic attack, unspecified: Secondary | ICD-10-CM | POA: Diagnosis present

## 2013-02-12 LAB — COMPREHENSIVE METABOLIC PANEL
Alkaline Phosphatase: 61 U/L (ref 39–117)
BUN: 12 mg/dL (ref 6–23)
CO2: 28 mEq/L (ref 19–32)
Calcium: 9.5 mg/dL (ref 8.4–10.5)
GFR calc Af Amer: >90 mL/min (ref >90)
GFR calc non Af Amer: 82 mL/min — ABNORMAL LOW (ref >90)
Glucose, Bld: 98 mg/dL (ref 70–99)
Total Protein: 7.4 g/dL (ref 6.0–8.3)

## 2013-02-12 LAB — GLUCOSE, CAPILLARY: Glucose-Capillary: 90 mg/dL (ref 70–99)

## 2013-02-12 LAB — LIPID PANEL
Total CHOL/HDL Ratio: 2.6 RATIO
VLDL: 15 mg/dL (ref 0–40)

## 2013-02-12 MED ORDER — HYDROCHLOROTHIAZIDE 25 MG PO TABS
25.0000 mg | ORAL_TABLET | Freq: Every day | ORAL | Status: DC
  Administered 2013-02-12: 25 mg via ORAL
  Filled 2013-02-12: qty 1

## 2013-02-12 MED ORDER — SODIUM CHLORIDE 0.9 % IJ SOLN
3.0000 mL | Freq: Two times a day (BID) | INTRAMUSCULAR | Status: DC
  Administered 2013-02-12: 3 mL via INTRAVENOUS

## 2013-02-12 MED ORDER — SIMVASTATIN 40 MG PO TABS
40.0000 mg | ORAL_TABLET | Freq: Every day | ORAL | Status: DC
  Filled 2013-02-12: qty 1

## 2013-02-12 MED ORDER — ADULT MULTIVITAMIN W/MINERALS CH
1.0000 | ORAL_TABLET | Freq: Every day | ORAL | Status: DC
  Administered 2013-02-12: 1 via ORAL
  Filled 2013-02-12: qty 1

## 2013-02-12 MED ORDER — ENOXAPARIN SODIUM 40 MG/0.4ML ~~LOC~~ SOLN
40.0000 mg | Freq: Every day | SUBCUTANEOUS | Status: DC
  Administered 2013-02-12: 40 mg via SUBCUTANEOUS
  Filled 2013-02-12: qty 0.4

## 2013-02-12 MED ORDER — ASPIRIN 325 MG PO TABS
325.0000 mg | ORAL_TABLET | Freq: Every day | ORAL | Status: DC
  Administered 2013-02-12: 325 mg via ORAL
  Filled 2013-02-12: qty 1

## 2013-02-12 MED ORDER — SODIUM CHLORIDE 0.9 % IV SOLN: 250.0000 mL | INTRAVENOUS | Status: DC | PRN

## 2013-02-12 MED ORDER — ATENOLOL 50 MG PO TABS
50.0000 mg | ORAL_TABLET | Freq: Every day | ORAL | Status: DC
  Administered 2013-02-12: 50 mg via ORAL
  Filled 2013-02-12: qty 1

## 2013-02-12 MED ORDER — CLOPIDOGREL BISULFATE 75 MG PO TABS
75.0000 mg | ORAL_TABLET | Freq: Every day | ORAL | Status: DC
  Administered 2013-02-12: 75 mg via ORAL
  Filled 2013-02-12: qty 1

## 2013-02-12 MED ORDER — STROKE: EARLY STAGES OF RECOVERY BOOK
Freq: Once | Status: AC
  Administered 2013-02-12: 08:00:00
  Filled 2013-02-12: qty 1

## 2013-02-12 MED ORDER — SODIUM CHLORIDE 0.9 % IJ SOLN: 3.0000 mL | INTRAMUSCULAR | Status: DC | PRN

## 2013-02-12 MED ORDER — ATORVASTATIN CALCIUM 10 MG PO TABS
5.0000 mg | ORAL_TABLET | Freq: Every day | ORAL | Status: DC
  Administered 2013-02-12: 5 mg via ORAL
  Filled 2013-02-12: qty 0.5

## 2013-02-12 MED ORDER — AMLODIPINE BESYLATE 2.5 MG PO TABS
2.5000 mg | ORAL_TABLET | Freq: Every day | ORAL | Status: DC
  Administered 2013-02-12: 2.5 mg via ORAL
  Filled 2013-02-12: qty 1

## 2013-02-12 MED ORDER — VALSARTAN-HYDROCHLOROTHIAZIDE 160-25 MG PO TABS
1.0000 | ORAL_TABLET | Freq: Every day | ORAL | Status: DC
Start: 2013-02-12 — End: 2013-02-12

## 2013-02-12 MED ORDER — ACETAMINOPHEN 325 MG PO TABS: 650.0000 mg | ORAL_TABLET | ORAL | Status: DC | PRN

## 2013-02-12 MED ORDER — IRBESARTAN 150 MG PO TABS
150.0000 mg | ORAL_TABLET | Freq: Every day | ORAL | Status: DC
  Administered 2013-02-12: 150 mg via ORAL
  Filled 2013-02-12: qty 1

## 2013-02-12 NOTE — Discharge Summary (Signed)
Physician Discharge Summary  Patient ID: Sarah Graves MRN: [id number] DOB/AGE: 1932-12-11 79 y.o.  Admit date: 02/11/2013 Discharge date: 02/12/2013   Discharge Diagnoses:  Principal Problem:   TIA (transient ischemic attack) Active Problems:   Essential hypertension, benign   EMPHYSEMA   Hyperlipidemia   Discharged Condition: good  Hospital Course:   The patient is a 79 year old woman with a history of TIA in 5/12, HTN, hyperlipidemia, and past tobacco abuse who presented to the ED with a complaint of right leg and facial numbness and weakness that started at about 2 pm while planting flowers. She had numbness and weakness, but was able to walk. Around 8 pm she developed right facial numbness and jaw pain. She checked her BP which had systolic BP over 409, so she took an extra atenolol and an extra aspirin dose. By the time that she presented to the ER her BP had normalized as well as her weakness and numbness. She was able to walk to the bathroom without assistance. She was admitted under observation status for further evaluation. She had EKG telemetry done which showed a normal sinus rhythm. She had a Brain MRI and MRA that did not show a stroke and showed no significant change in intracranial atherosclerosis with no evidence of large vessel obstruction. A carotid ultrasound test was normal, and an echo was done with results pending. On the morning of discharge she had no difficulty with speech or swallowing, and no weakness or numbness.   Consults: None  Significant Diagnostic Studies:  Ct Head (brain) Wo Contrast  02/11/2013  *RADIOLOGY REPORT*  Clinical Data: Right leg weakness.  CT HEAD WITHOUT CONTRAST  Technique:  Contiguous axial images were obtained from the base of the skull through the vertex without contrast.  Comparison: Brain MRI 02/14/2011  Findings: No acute intracranial hemorrhage.  No focal mass lesion. No CT evidence of acute infarction.   No midline shift or mass effect.  No  hydrocephalus.  Basilar cisterns are patent.  Paranasal sinuses and mastoid air cells are clear.  Orbits are normal.  IMPRESSION: No acute intracranial findings.  No change from prior MRI.   Original Report Authenticated By: Genevive Bi, M.D.    Mri Brain Without Contrast  02/12/2013  *RADIOLOGY REPORT*  Clinical Data:  Right-sided weakness and numbness.  MRI HEAD WITHOUT CONTRAST MRA HEAD WITHOUT CONTRAST  Technique:  Multiplanar, multiecho pulse sequences of the brain and surrounding structures were obtained without intravenous contrast. Angiographic images of the head were obtained using MRA technique without contrast.  Comparison:  CT 02/11/2013, MRI 02/14/2011  MRI HEAD  Findings:  Negative for acute infarct.  Mild chronic microvascular ischemic change in the white matter. Small chronic infarct left posterior frontal cortex is unchanged from the prior MRI. Small chronic infarct in the right head of the caudate is  unchanged.  Small chronic infarcts in the cerebellum bilaterally.  Negative for hemorrhage.  No mass or edema.  Ventricle size is normal and there is no shift of the midline structures.  IMPRESSION: No acute infarct.  Mild chronic ischemic change.  MRA HEAD  Findings: Both vertebral arteries are patent to the basilar.  Right PICA is patent.  Small left PICA.  The basilar is widely patent. Fetal origin of the right posterior cerebral artery with hypoplastic right P1 segment.  There is moderate atherosclerotic disease in the right posterior cerebral artery unchanged.  Mild disease in the left posterior cerebral artery.  Right internal carotid artery is  patent with mild atherosclerotic disease in the cavernous segment.  There is mild stenosis in the right A1 segment.  Atherosclerotic disease is present in the right middle cerebral artery branches, similar to the prior study.  Left internal carotid artery is patent.  Atherosclerotic irregularity in the cavernous carotid on the left.  The left  anterior cerebral artery is widely patent.  Atherosclerotic disease in the left middle cerebral artery branches similar to the prior study.  IMPRESSION: Mild to moderate intracranial atherosclerotic disease is similar to 2012.  No large vessel occlusion.   Original Report Authenticated By: Janeece Riggers, M.D.    Mr Mra Head/brain Wo Cm  02/12/2013  *RADIOLOGY REPORT*  Clinical Data:  Right-sided weakness and numbness.  MRI HEAD WITHOUT CONTRAST MRA HEAD WITHOUT CONTRAST  Technique:  Multiplanar, multiecho pulse sequences of the brain and surrounding structures were obtained without intravenous contrast. Angiographic images of the head were obtained using MRA technique without contrast.  Comparison:  CT 02/11/2013, MRI 02/14/2011  MRI HEAD  Findings:  Negative for acute infarct.  Mild chronic microvascular ischemic change in the white matter. Small chronic infarct left posterior frontal cortex is unchanged from the prior MRI. Small chronic infarct in the right head of the caudate is  unchanged.  Small chronic infarcts in the cerebellum bilaterally.  Negative for hemorrhage.  No mass or edema.  Ventricle size is normal and there is no shift of the midline structures.  IMPRESSION: No acute infarct.  Mild chronic ischemic change.  MRA HEAD  Findings: Both vertebral arteries are patent to the basilar.  Right PICA is patent.  Small left PICA.  The basilar is widely patent. Fetal origin of the right posterior cerebral artery with hypoplastic right P1 segment.  There is moderate atherosclerotic disease in the right posterior cerebral artery unchanged.  Mild disease in the left posterior cerebral artery.  Right internal carotid artery is patent with mild atherosclerotic disease in the cavernous segment.  There is mild stenosis in the right A1 segment.  Atherosclerotic disease is present in the right middle cerebral artery branches, similar to the prior study.  Left internal carotid artery is patent.  Atherosclerotic  irregularity in the cavernous carotid on the left.  The left anterior cerebral artery is widely patent.  Atherosclerotic disease in the left middle cerebral artery branches similar to the prior study.  IMPRESSION: Mild to moderate intracranial atherosclerotic disease is similar to 2012.  No large vessel occlusion.   Original Report Authenticated By: Janeece Riggers, M.D.     Labs: Lab Results  Component Value Date   WBC 5.9 02/11/2013   HGB 12.9 02/11/2013   HCT 38.0 02/11/2013   MCV 88.3 02/11/2013   PLT 297 02/11/2013     Recent Labs Lab 02/11/13 2239 02/11/13 2347  NA 135 139  K 4.1 3.9  CL 98 103  CO2 28  --   BUN 12 13  CREATININE 0.68 0.80  CALCIUM 9.5  --   PROT 7.4  --   BILITOT 0.2*  --   ALKPHOS 61  --   ALT 14  --   AST 29  --   GLUCOSE 98 96       Lab Results  Component Value Date   INR 0.97 02/11/2013   INR 0.95 02/14/2011   INR 0.91 11/12/2010     No results found for this or any previous visit (from the past 240 hour(s)).    Discharge Exam: Blood pressure 106/66, pulse 52, temperature  97.9 F (36.6 C), temperature source Oral, resp. rate 14, height 5\' 4"  (1.626 m), weight 79.833 kg (176 lb), SpO2 95.00%.  Physical Exam: In general the patient is a well nourished and well developed white woman who was in no apparent distress. HEENT exam was within normal limits, neck was supple and without JVD or carotid bruit, chest was clear to auscultation, heart had a regular rate and rhythm, abdomen was benign, extremities had 2+ pitting edema, neuro exam: alert and oriented x 4, cranial nerves 2-12 were normal, sensory exam was grossly normal, motor strength was 5/5 throughout, she had bilateral normal finger to nose tests, and gait was normal  Disposition: She will be discharged to home and will not need home health nursing or PT and OT as she has no persistent neurological deficits  Discharge Orders   Future Orders Complete By Expires     Call MD for:  As directed      Comments:      Call physician for sudden weakness or loss of sensation, difficulty with vision or speech, chest pain, or any other concerning symptoms.    Diet - low sodium heart healthy  As directed     Discharge instructions  As directed     Comments:      You should call our office tomorrow to set up a after hospitalization followup visit within 1 week of discharge.    Increase activity slowly  As directed         Medication List    TAKE these medications       amLODipine 2.5 MG tablet  Commonly known as:  NORVASC  Take 2.5 mg by mouth daily.     aspirin 325 MG tablet  Take 325 mg by mouth daily.     atenolol 50 MG tablet  Commonly known as:  TENORMIN  Take 50 mg by mouth daily. May take additional dose if BP is high     clopidogrel 75 MG tablet  Commonly known as:  PLAVIX  Take 75 mg by mouth daily.     multivitamin with minerals Tabs  Take 1 tablet by mouth daily.     pravastatin 20 MG tablet  Commonly known as:  PRAVACHOL  Take 20 mg by mouth daily.     valsartan-hydrochlorothiazide 160-25 MG per tablet  Commonly known as:  DIOVAN-HCT  Take 1 tablet by mouth daily.         Signed: Garlan Fillers 02/12/2013, 9:05 PM

## 2013-02-12 NOTE — Progress Notes (Signed)
VASCULAR LAB PRELIMINARY  PRELIMINARY  PRELIMINARY  PRELIMINARY  Carotid Dopplers completed.    Preliminary report:  No significant ICA stenosis.  Vertebral artery flow is antegrade.  Sarah Graves, RVT 02/12/2013, 2:54 PM

## 2013-02-12 NOTE — Progress Notes (Signed)
Utilization review completed.  

## 2013-02-12 NOTE — H&P (Signed)
PCP:   Garlan Fillers, MD   Chief Complaint:  Right leg and facial numbness/weakness  HPI: Sarah Graves is a 79 year old white female with a history of TIA (5/12), hypertension and hyperlipidemia who presented to the emergency department with the complaint of right leg and facial numbness and weakness.  She reports that she was in her usual state of health today at around 2:00pm when she was planting flowers. She developed right leg pain from her groin down to her lower leg.  She describes this as numbness associated with some weakness. Around 8 PM this evening she developed right facial numbness and pain into her jaw. She checked her blood pressure and her systolic blood pressure was over 200. She took an extra atenolol and aspirin.The symptoms were reminiscent of her TIA in 5/12 and prompted her to come to the emergency department.while in the emergency department her blood pressure has improved with resolution of her right leg weakness. She was able to ambulate to the bathroom without any weakness or numbness. Her right facial numbness has also resolved.  She reports that she is compliant with her medications including aspirin and Plavix every day. She's taking her blood pressure medications regularly. Typically, her systolic blood pressure is between 100-112.  The last time it was this time was when she had her TIA. She has no history of atrial fibrillation and denies any palpitations. Prior carotid Dopplers and 2010 showed no significant stenosis.  Review of Systems:  Review of Systems - Negative except HPI.  Past Medical History: Osteoporosis , 2006 BMD T -2.9, 11/06 Labs; borderline Vit D deficiency Tobacco Use, Emphysema, stopped smoking in January 2011 1970 PE; secondary to use of OCPs Atypical Chest Pain, 4/11 Stress Echocardiogram showed no evidence of ischemia with hypertension HTN, Essential, benign (B) cerebrovascular disease 6/10 carotid US with bilateral less than 40 % stenosis of  ICAs Dyspnea/cough 2011 Weight Gain, exogenous, associated with stopping smoking 2/12 CT head showed an old lacunar infarct May 2012 Hospital admit for TIAs with workup showing mild intracranial arteriosclerosis  Past Surgical History T&A NO COLONOSCOPIES RECORDED in digital record (she does not want one)  Medications: Prior to Admission medications   Medication Sig Start Date End Date Taking? Authorizing Provider  amLODipine (NORVASC) 2.5 MG tablet Take 2.5 mg by mouth daily.   Yes Historical Provider, MD  aspirin 325 MG tablet Take 325 mg by mouth daily.   Yes Historical Provider, MD  atenolol (TENORMIN) 50 MG tablet Take 50 mg by mouth daily. May take additional dose if BP is high   Yes Historical Provider, MD  clopidogrel (PLAVIX) 75 MG tablet Take 75 mg by mouth daily.   Yes Historical Provider, MD  Multiple Vitamin (MULTIVITAMIN WITH MINERALS) TABS Take 1 tablet by mouth daily.   Yes Historical Provider, MD  pravastatin (PRAVACHOL) 20 MG tablet Take 20 mg by mouth daily.   Yes Historical Provider, MD  valsartan-hydrochlorothiazide (DIOVAN-HCT) 160-25 MG per tablet Take 1 tablet by mouth daily.   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 PERRL, EOM's intact.  Nose: Nares normal. Septum midline. Mucosa  normal. No drainage or sinus tenderness. Throat: lips, mucosa, and tongue normal; teeth and gums normal Neck: no adenopathy, no carotid bruit, no JVD and thyroid not enlarged, symmetric, no tenderness/mass/nodules Resp: clear to auscultation bilaterally Cardio: regular rate and rhythm GI: soft, non-tender; bowel sounds normal; no masses,  no organomegaly Extremities: extremities normal, atraumatic, no cyanosis or edema Pulses: 2+ and symmetric Lymph nodes: Cervical adenopathy: no cervical lymphadenopathy Neurologic: Alert and oriented X 3, normal strength and tone. Normal symmetric reflexes. Mild right facial asymmetry. Speech intact. Gait not tested. Finger to nose intact  Labs on Admission:   Recent Labs  02/11/13 2239 02/11/13 2347  NA 135 139  K 4.1 3.9  CL 98 103  CO2 28  --   GLUCOSE 98 96  BUN 12 13  CREATININE 0.68 0.80  CALCIUM 9.5  --     Recent Labs  02/11/13 2239  AST 29  ALT 14  ALKPHOS 61  BILITOT 0.2*  PROT 7.4  ALBUMIN 3.7     Recent Labs  02/11/13 2239 02/11/13 2347  WBC 5.9  --   NEUTROABS 3.0  --   HGB 12.3 12.9  HCT 34.1* 38.0  MCV 88.3  --   PLT 297  --     Recent Labs  02/11/13 2240  TROPONINI <0.30   Lab Results  Component Value Date   INR 0.97 02/11/2013   INR 0.95 02/14/2011   INR 0.91 11/12/2010    Radiological Exams on Admission: Ct Head (brain) Wo Contrast  02/11/2013  *RADIOLOGY REPORT*  Clinical Data: Right leg weakness.  CT HEAD WITHOUT CONTRAST  Technique:  Contiguous axial images were obtained from the base of the skull through the vertex without contrast.  Comparison: Brain MRI 02/14/2011  Findings: No acute intracranial hemorrhage.  No focal mass lesion. No CT evidence of acute infarction.   No midline shift or mass effect.  No hydrocephalus.  Basilar cisterns are patent.  Paranasal sinuses and mastoid air cells are clear.  Orbits are normal.  IMPRESSION: No acute intracranial findings.  No change from prior MRI.   Original  Report Authenticated By: Genevive Bi, M.D.    Orders placed during the hospital encounter of 02/11/13  . ED EKG- NSR with poor R wave progression    Assessment/Plan Principal Problem: 1. TIA (transient ischemic attack)- her transient right leg and facial weakness/numbness are consistent with a TIA. Her symptoms have resolved with improvement in her blood pressure. TPA is not indicated given improvement in her symptoms and duration of onset greater than 6 hours.  She is on maximal medical therapy for secondary stroke prevention including aspirin, Plavix, anti-hypertensives, and statin therapy. Will admit for observation and evaluate further with MRI/MRA, carotid Dopplers, and echocardiogram. PT/OT evaluation ordered.  RN bedside swallow screen.  If secondary evaluation is negative, will discharge home on continued medical therapy for stroke prevention. Active Problems: 2. Essential hypertension, benign- her blood pressure was markedly elevated in the setting of her TIA. We'll avoid hypotension to try to maintain blood pressure around 140/90. We'll resume her home medications and monitor. 3. Hyperlipidemia- she is on statin therapy and her prior cholesterol was at goal.  We'll check fasting lipid profile in the morning. Goal LDL less than 100, ideally less than 70. 4. Disposition-anticipate discharge within 24 hours if additional testing is negative.  Sarah Graves,W DOUGLAS 02/12/2013, 12:55 AM

## 2013-02-12 NOTE — Progress Notes (Signed)
Pt given discharge instructions with understanding. No questions at this time. Monitor d/c and iv d/c. Pt son at bedside pt d/c to home.

## 2013-02-12 NOTE — Evaluation (Signed)
Physical Therapy Evaluation Patient Details Name: Sarah Graves MRN: [id number] DOB: 1933-07-11 Today's Date: 02/12/2013 Time: 4098-1191 PT Time Calculation (min): 25 min  PT Assessment / Plan / Recommendation Clinical Impression  Sarah Graves is a 79 y/o female admitted for s/s of TIA. Pt presents to PT with no residual weakness and demonstrating independence with all mobility.   No acute PT needs.  Acute PT signing off.     PT Assessment  Sarah Graves does not need any further PT services    Follow Up Recommendations  No PT follow up    Does the patient have the potential to tolerate intense rehabilitation      Barriers to Discharge        Equipment Recommendations  None recommended by PT    Recommendations for Other Services     Frequency      Precautions / Restrictions Precautions Precautions: None Restrictions Weight Bearing Restrictions: No   Pertinent Vitals/Pain No c/o pain.   BP 147/79                       HR 52 sitting at rest     Mobility  Bed Mobility Bed Mobility: Supine to Sit;Sitting - Scoot to Edge of Bed;Sit to Supine Supine to Sit: 7: Independent;HOB flat Sitting - Scoot to Edge of Bed: 7: Independent Sit to Supine: 7: Independent;HOB flat Transfers Transfers: Sit to Stand;Stand to Sit Sit to Stand: 7: Independent;From bed;From chair/3-in-1;With upper extremity assist Stand to Sit: 7: Independent;To bed;To chair/3-in-1;With upper extremity assist Ambulation/Gait Ambulation/Gait Assistance: 7: Independent Ambulation Distance (Feet): 200 Feet Assistive device: None Gait Pattern: Within Functional Limits Gait velocity: WFL General Gait Details: Pt demonstrates safety and independence with no AD>  Stairs: No Wheelchair Mobility Wheelchair Mobility: No Modified Rankin (Stroke Patients Only) Pre-Morbid Rankin Score: No symptoms Modified Rankin: No symptoms    Exercises     PT Diagnosis:    PT Problem List:   PT Treatment Interventions:     PT  Goals Acute Rehab PT Goals PT Goal Formulation: With patient  Visit Information  Last PT Received On: 02/12/13 Assistance Needed: +1    Subjective Data  Subjective: agree to PT eval Patient Stated Goal: Return to home   Prior Functioning  Home Living Lives With: Alone Available Help at Discharge: Family;Available PRN/intermittently Type of Home: House Home Access: Stairs to enter [employer] of Steps: 2 Entrance Stairs-Rails: None Home Layout: One level Home Adaptive Equipment: None Prior Function Level of Independence: Independent Able to Take Stairs?: Yes Driving: Yes Vocation: Retired Musician: No difficulties Dominant Hand: Right    Cognition  Cognition Arousal/Alertness: Awake/alert Behavior During Therapy: WFL for tasks assessed/performed Overall Cognitive Status: Within Functional Limits for tasks assessed    Extremity/Trunk Assessment Right Upper Extremity Assessment RUE ROM/Strength/Tone: Within functional levels Left Upper Extremity Assessment LUE ROM/Strength/Tone: Within functional levels Right Lower Extremity Assessment RLE ROM/Strength/Tone: Within functional levels RLE Sensation: WFL - Light Touch;WFL - Proprioception RLE Coordination: WFL - gross motor Left Lower Extremity Assessment LLE ROM/Strength/Tone: Within functional levels LLE Sensation: WFL - Light Touch;WFL - Proprioception LLE Coordination: WFL - gross motor Trunk Assessment Trunk Assessment: Normal   Balance Balance Balance Assessed: Yes Static Standing Balance Static Standing - Balance Support: No upper extremity supported Static Standing - Level of Assistance: 7: Independent Static Standing - Comment/# of Minutes: 30 seconds with no LOB eyes open, >15 seconds with no LOB eye closed.   End of Session  PT - End of Session Equipment Utilized During Treatment: Gait belt Activity Tolerance: Patient tolerated treatment well Patient left: in chair;with  call bell/phone within reach;with family/visitor present Nurse Communication: Mobility status  GP     Jaeline Whobrey 02/12/2013, 1:38 PM Bodi Palmeri L. Lacey Wallman DPT [phone]

## 2013-02-12 NOTE — Plan of Care (Signed)
Problem: Phase I Progression Outcomes Goal: Antithrombotic given by end of Day 2 Outcome: Completed/Met Date Met:  02/12/13 Lovenox

## 2013-02-12 NOTE — Progress Notes (Signed)
Subjective: Feels fine today with no numbness, weakness, or difficulty swallowing.  Objective: Vital signs in last 24 hours: Temp:  [97.7 F (36.5 C)-98.9 F (37.2 C)] 98.5 F (36.9 C) (05/08 0737) Pulse Rate:  [5-69] 55 (05/08 0737) Resp:  [14-21] 14 (05/08 0737) BP: (124-214)/(59-97) 144/78 mmHg (05/08 0737) SpO2:  [96 %-98 %] 96 % (05/08 0737) Weight:  [79.833 kg (176 lb)] 79.833 kg (176 lb) (05/08 0200) Weight change:    Intake/Output from previous day:     General appearance: alert, cooperative and no distress Resp: clear to auscultation bilaterally Cardio: regular rate and rhythm, S1, S2 normal, no murmur, click, rub or gallop Extremities: bilateral 1+ leg edema Neurologic: Alert and oriented X 3, normal strength and tone. Normal symmetric reflexes. Normal coordination and gait  Lab Results:  Recent Labs  02/11/13 2239 02/11/13 2347  WBC 5.9  --   HGB 12.3 12.9  HCT 34.1* 38.0  PLT 297  --    BMET  Recent Labs  02/11/13 2239 02/11/13 2347  NA 135 139  K 4.1 3.9  CL 98 103  CO2 28  --   GLUCOSE 98 96  BUN 12 13  CREATININE 0.68 0.80  CALCIUM 9.5  --    CMET CMP     Component Value Date/Time   NA 139 02/11/2013 2347   K 3.9 02/11/2013 2347   CL 103 02/11/2013 2347   CO2 28 02/11/2013 2239   GLUCOSE 96 02/11/2013 2347   BUN 13 02/11/2013 2347   CREATININE 0.80 02/11/2013 2347   CALCIUM 9.5 02/11/2013 2239   PROT 7.4 02/11/2013 2239   ALBUMIN 3.7 02/11/2013 2239   AST 29 02/11/2013 2239   ALT 14 02/11/2013 2239   ALKPHOS 61 02/11/2013 2239   BILITOT 0.2* 02/11/2013 2239   GFRNONAA 82* 02/11/2013 2239   GFRAA >90 02/11/2013 2239    CBG (last 3)   Recent Labs  02/11/13 2245 02/12/13 0757  GLUCAP 80 90    INR RESULTS:   Lab Results  Component Value Date   INR 0.97 02/11/2013   INR 0.95 02/14/2011   INR 0.91 11/12/2010     Studies/Results: Ct Head (brain) Wo Contrast  02/11/2013  *RADIOLOGY REPORT*  Clinical Data: Right leg weakness.  CT HEAD WITHOUT CONTRAST   Technique:  Contiguous axial images were obtained from the base of the skull through the vertex without contrast.  Comparison: Brain MRI 02/14/2011  Findings: No acute intracranial hemorrhage.  No focal mass lesion. No CT evidence of acute infarction.   No midline shift or mass effect.  No hydrocephalus.  Basilar cisterns are patent.  Paranasal sinuses and mastoid air cells are clear.  Orbits are normal.  IMPRESSION: No acute intracranial findings.  No change from prior MRI.   Original Report Authenticated By: Genevive Bi, M.D.     Medications: I have reviewed the patient's current medications.  Assessment/Plan: #1 Right sided numbness: likely due to a TIA and full resolution of sxs now. Will check carotid US, brain MRI and MRA, and daytime telemetry and will likely discharge to home later in the day if no signs of a stroke and condition remains unchanged.  #2 HTN: normalized with resolution of TIA sxs.   LOS: 1 day   Sarah Graves G 02/12/2013, 8:37 AM

## 2013-02-12 NOTE — Progress Notes (Signed)
  Echocardiogram 2D Echocardiogram has been performed.  Shweta Aman 02/12/2013, 11:19 AM

## 2013-05-26 ENCOUNTER — Emergency Department (HOSPITAL_COMMUNITY)
Admission: EM | Admit: 2013-05-26 | Discharge: 2013-05-26 | Disposition: A | Discharge status: Home / Self Care | Payer: Medicare Other | Attending: Emergency Medicine | Admitting: Emergency Medicine

## 2013-05-26 ENCOUNTER — Encounter (HOSPITAL_COMMUNITY): Payer: Self-pay | Admitting: Cardiology

## 2013-05-26 DIAGNOSIS — R5381 Other malaise: Secondary | ICD-10-CM | POA: Insufficient documentation

## 2013-05-26 DIAGNOSIS — Z8673 Personal history of transient ischemic attack (TIA), and cerebral infarction without residual deficits: Secondary | ICD-10-CM | POA: Insufficient documentation

## 2013-05-26 DIAGNOSIS — Z7982 Long term (current) use of aspirin: Secondary | ICD-10-CM | POA: Insufficient documentation

## 2013-05-26 DIAGNOSIS — Z79899 Other long term (current) drug therapy: Secondary | ICD-10-CM | POA: Insufficient documentation

## 2013-05-26 DIAGNOSIS — I10 Essential (primary) hypertension: Secondary | ICD-10-CM

## 2013-05-26 LAB — CBC
HCT: 37.7 % (ref 36.0–46.0)
Hemoglobin: 13.4 g/dL (ref 12.0–15.0)
MCH: 32.1 pg (ref 26.0–34.0)
MCHC: 35.5 g/dL (ref 30.0–36.0)

## 2013-05-26 LAB — BASIC METABOLIC PANEL
BUN: 13 mg/dL (ref 6–23)
Calcium: 10.3 mg/dL (ref 8.4–10.5)
GFR calc non Af Amer: 85 mL/min — ABNORMAL LOW (ref >90)
Glucose, Bld: 97 mg/dL (ref 70–99)
Sodium: 133 mEq/L — ABNORMAL LOW (ref 135–145)

## 2013-05-26 MED ORDER — AMLODIPINE BESYLATE 2.5 MG PO TABS: 5.0000 mg | ORAL_TABLET | Freq: Every day | ORAL | Status: DC

## 2013-05-26 NOTE — ED Provider Notes (Signed)
CSN: [id number]     Arrival date & time 05/26/13  1654 History     First MD Initiated Contact with Patient 05/26/13 1922     Chief Complaint  Patient presents with  . Hypertension  . Weakness   (Consider location/radiation/quality/duration/timing/severity/associated sxs/prior Treatment) HPI Comments: 79 year old female with a history of hypertension and transient ischemic attacks in the past presents with complaint of hypertension. The patient states that while she was at home today she developed a feeling of difficulty ambulating.  She states that she takes her blood pressure medication the morning, she noticed these symptoms midmorning and noticed that her blood pressure was very high including a systolic over 200. It has gradually come down over the afternoon and her symptoms have gradually gone away. At this time she feels like she is back to resolve. She denies chest pain, palpitations, headache, blurred vision, also balance, numbness, weakness or tingling.  Patient is a 79 y.o. female presenting with hypertension and weakness. The history is provided by the patient.  Hypertension  Weakness    Past Medical History  Diagnosis Date  . Hypertension    History reviewed. No pertinent past surgical history. History reviewed. No pertinent family history. History  Substance Use Topics  . Smoking status: Never Smoker   . Smokeless tobacco: Not on file  . Alcohol Use: No   OB History   Grav Para Term Preterm Abortions TAB SAB Ect Mult Living                 Review of Systems  Neurological: Positive for weakness.  All other systems reviewed and are negative.    Allergies  Review of patient's allergies indicates no known allergies.  Home Medications   Current Outpatient Rx  Name  Route  Sig  Dispense  Refill  . amLODipine (NORVASC) 2.5 MG tablet   Oral   Take 2 tablets (5 mg total) by mouth daily.   30 tablet   1   . aspirin 325 MG tablet   Oral   Take 325 mg by  mouth daily.         Marland Kitchen atenolol (TENORMIN) 50 MG tablet   Oral   Take 50 mg by mouth daily. May take additional dose if BP is high         . clopidogrel (PLAVIX) 75 MG tablet   Oral   Take 75 mg by mouth daily.         . Multiple Vitamin (MULTIVITAMIN WITH MINERALS) TABS   Oral   Take 1 tablet by mouth daily.         . pravastatin (PRAVACHOL) 20 MG tablet   Oral   Take 20 mg by mouth daily.         . valsartan-hydrochlorothiazide (DIOVAN-HCT) 160-25 MG per tablet   Oral   Take 1 tablet by mouth daily.          BP 175/103  Pulse 53  Temp(Src) 98 F (36.7 C) (Oral)  Resp 16  SpO2 99% Physical Exam  Nursing note and vitals reviewed. Constitutional: She appears well-developed and well-nourished. No distress.  HENT:  Head: Normocephalic and atraumatic.  Mouth/Throat: Oropharynx is clear and moist. No oropharyngeal exudate.  Eyes: Conjunctivae and EOM are normal. Pupils are equal, round, and reactive to light. Right eye exhibits no discharge. Left eye exhibits no discharge. No scleral icterus.  Neck: Normal range of motion. Neck supple. No JVD present. No thyromegaly present.  Cardiovascular: Normal rate, regular  rhythm, normal heart sounds and intact distal pulses.  Exam reveals no gallop and no friction rub.   No murmur heard. No carotid bruit  Pulmonary/Chest: Effort normal and breath sounds normal. No respiratory distress. She has no wheezes. She has no rales.  Abdominal: Soft. Bowel sounds are normal. She exhibits no distension and no mass. There is no tenderness.  Musculoskeletal: Normal range of motion. She exhibits no edema and no tenderness.  Lymphadenopathy:    She has no cervical adenopathy.  Neurological: She is alert. Coordination normal.  Neurologic exam:  Speech clear, pupils equal round reactive to light, extraocular movements intact  Normal peripheral visual fields Cranial nerves III through XII normal including no facial droop Follows  commands, moves all extremities x4, normal strength to bilateral upper and lower extremities at all major muscle groups including grip Sensation normal to light touch and pinprick Coordination intact, no limb ataxia, finger-nose-finger normal Rapid alternating movements normal No pronator drift Gait normal   Skin: Skin is warm and dry. No rash noted. No erythema.  Psychiatric: She has a normal mood and affect. Her behavior is normal.    ED Course   Procedures (including critical care time)  Labs Reviewed  BASIC METABOLIC PANEL - Abnormal; Notable for the following:    Sodium 133 (*)    GFR calc non Af Amer 85 (*)    All other components within normal limits  CBC  POCT I-STAT TROPONIN I   No results found. 1. Hypertension     MDM  Overall the patient appears well, she is hypertensive, I personally checked her with a manual blood pressure cuff at 170/100. She is mildly bradycardic with a pulse around 60 and has no focal symptoms on exam and appears well, feels better.  Pt stable - VS improving - increase norvasc, no sx at this time.  Meds given in ED:  Medications - No data to display  New Prescriptions   No medications on file      Vida Roller, MD 05/26/13 2032

## 2013-05-26 NOTE — ED Notes (Addendum)
Pt reports that she has been feeling weak this afternoon and unsteady on her feet. States that her BP was over 200 at home. No focal weakness noted. Pt A&Ox4. States that she took BP medication and asa PTA. Reports some mild SOB

## 2015-01-27 ENCOUNTER — Encounter (HOSPITAL_COMMUNITY): Payer: Self-pay | Admitting: *Deleted

## 2015-01-27 ENCOUNTER — Emergency Department (HOSPITAL_COMMUNITY)
Admission: EM | Admit: 2015-01-27 | Discharge: 2015-01-27 | Disposition: A | Discharge status: Home / Self Care | Payer: Medicare Other | Attending: Emergency Medicine | Admitting: Emergency Medicine

## 2015-01-27 DIAGNOSIS — I10 Essential (primary) hypertension: Secondary | ICD-10-CM | POA: Diagnosis not present

## 2015-01-27 DIAGNOSIS — J449 Chronic obstructive pulmonary disease, unspecified: Secondary | ICD-10-CM | POA: Diagnosis not present

## 2015-01-27 DIAGNOSIS — Z8673 Personal history of transient ischemic attack (TIA), and cerebral infarction without residual deficits: Secondary | ICD-10-CM | POA: Diagnosis not present

## 2015-01-27 DIAGNOSIS — Z79899 Other long term (current) drug therapy: Secondary | ICD-10-CM | POA: Insufficient documentation

## 2015-01-27 HISTORY — DX: Cerebral infarction, unspecified: I63.9

## 2015-01-27 HISTORY — DX: Chronic obstructive pulmonary disease, unspecified: J44.9

## 2015-01-27 LAB — BASIC METABOLIC PANEL
Anion gap: 10 (ref 5–15)
BUN: 12 mg/dL (ref 6–23)
CHLORIDE: 95 mmol/L — AB (ref 96–112)
CO2: 27 mmol/L (ref 19–32)
CREATININE: 0.76 mg/dL (ref 0.50–1.10)
Calcium: 10 mg/dL (ref 8.4–10.5)
GFR calc Af Amer: >90 mL/min (ref >90)
GFR calc non Af Amer: 78 mL/min — ABNORMAL LOW (ref >90)
Glucose, Bld: 104 mg/dL — ABNORMAL HIGH (ref 70–99)
Potassium: 4.6 mmol/L (ref 3.5–5.1)
Sodium: 132 mmol/L — ABNORMAL LOW (ref 135–145)

## 2015-01-27 LAB — CBC
HEMATOCRIT: 40.7 % (ref 36.0–46.0)
HEMOGLOBIN: 13.9 g/dL (ref 12.0–15.0)
MCH: 31 pg (ref 26.0–34.0)
MCHC: 34.2 g/dL (ref 30.0–36.0)
MCV: 90.8 fL (ref 78.0–100.0)
Platelets: 320 10*3/uL (ref 150–400)
RBC: 4.48 MIL/uL (ref 3.87–5.11)
RDW: 12.7 % (ref 11.5–15.5)
WBC: 6.9 10*3/uL (ref 4.0–10.5)

## 2015-01-27 LAB — I-STAT TROPONIN, ED: Troponin i, poc: 0 ng/mL (ref 0.00–0.08)

## 2015-01-27 NOTE — Discharge Instructions (Signed)

## 2015-01-27 NOTE — ED Provider Notes (Signed)
CSN: [id number]     Arrival date & time 01/27/15  1921 History   First MD Initiated Contact with Patient 01/27/15 2023     Chief Complaint  Patient presents with  . Hypertension   HPI Patient presents to the emergency room because her blood pressure was elevated this evening. Patient does have a history of hypertension. Normally her blood pressure runs in the 426S or 341D systolic. This evening she checked her blood pressure and it was 211/110. The patient was concerned about this and decided to come to the emergency room. Patient denies any trouble with any chest pain today. She denies any shortness of breath. She denies any numbness or weakness. She does think she was having some symptoms last week but they have not recurred today. He did take her medications at home and right now she is feeling better. Past Medical History  Diagnosis Date  . Hypertension   . Stroke   . COPD (chronic obstructive pulmonary disease)    History reviewed. No pertinent past surgical history. No family history on file. History  Substance Use Topics  . Smoking status: Never Smoker   . Smokeless tobacco: Not on file  . Alcohol Use: No   OB History    No data available     Review of Systems  All other systems reviewed and are negative.     Allergies  Motrin  Home Medications   Prior to Admission medications   Medication Sig Start Date End Date Taking? Authorizing Provider  aspirin 325 MG tablet Take 325 mg by mouth every evening.    Yes Historical Provider, MD  atenolol (TENORMIN) 50 MG tablet Take 50 mg by mouth every evening. May take additional dose if BP is high   Yes Historical Provider, MD  clopidogrel (PLAVIX) 75 MG tablet Take 75 mg by mouth every evening.    Yes Historical Provider, MD  Multiple Vitamin (MULTIVITAMIN WITH MINERALS) TABS Take 1 tablet by mouth every evening.    Yes Historical Provider, MD  pravastatin (PRAVACHOL) 20 MG tablet Take 20 mg by mouth every evening.    Yes  Historical Provider, MD  valsartan-hydrochlorothiazide (DIOVAN-HCT) 160-25 MG per tablet Take 1 tablet by mouth every evening.    Yes Historical Provider, MD  amLODipine (NORVASC) 2.5 MG tablet Take 2 tablets (5 mg total) by mouth daily. 05/26/13   Noemi Chapel, MD   BP 192/83 mmHg  Pulse 57  Temp(Src) 98.9 F (37.2 C)  Resp 17  SpO2 99% Physical Exam  Constitutional: She appears well-developed and well-nourished. No distress.  HENT:  Head: Normocephalic and atraumatic.  Right Ear: External ear normal.  Left Ear: External ear normal.  Eyes: Conjunctivae are normal. Right eye exhibits no discharge. Left eye exhibits no discharge. No scleral icterus.  Neck: Neck supple. No tracheal deviation present.  Cardiovascular: Normal rate, regular rhythm and intact distal pulses.   Pulmonary/Chest: Effort normal and breath sounds normal. No stridor. No respiratory distress. She has no wheezes. She has no rales.  Abdominal: Soft. Bowel sounds are normal. She exhibits no distension. There is no tenderness. There is no rebound and no guarding.  Musculoskeletal: She exhibits no edema or tenderness.  Neurological: She is alert. She has normal strength. No cranial nerve deficit (no facial droop, extraocular movements intact, no slurred speech) or sensory deficit. She exhibits normal muscle tone. She displays no seizure activity. Coordination normal.  Skin: Skin is warm and dry. No rash noted.  Psychiatric: She has a  normal mood and affect.  Nursing note and vitals reviewed.   ED Course  Procedures (including critical care time) Labs Review Labs Reviewed  BASIC METABOLIC PANEL - Abnormal; Notable for the following:    Sodium 132 (*)    Chloride 95 (*)    Glucose, Bld 104 (*)    GFR calc non Af Amer 78 (*)    All other components within normal limits  CBC  I-STAT TROPOININ, ED    MDM   Final diagnoses:  Essential hypertension    Pt has asymptomatic hypertension.  She took an extra dose of  her tenormin here  In the ED.  She is supposed to be able to take it twice daily but has been taking it once per day.  Will increase to bid.  Follow up with PCP  At this time there does not appear to be any evidence of an acute emergency medical condition and the patient appears stable for discharge with appropriate outpatient follow up.     Dorie Rank, MD 01/27/15 [phone]

## 2015-01-27 NOTE — ED Notes (Signed)
Pt states that's she checked her BP at home and it was 211/110. Pt takes BP medication and took her BP medicine at 1620.  Also thinks that she had a mini stroke last week because she experienced leg cramping, fast heart beat, felt woozy. Denies symptoms currently.

## 2015-05-03 ENCOUNTER — Telehealth: Payer: Self-pay | Admitting: Internal Medicine

## 2015-05-03 ENCOUNTER — Encounter: Payer: Self-pay | Admitting: Internal Medicine

## 2015-05-03 ENCOUNTER — Ambulatory Visit (INDEPENDENT_AMBULATORY_CARE_PROVIDER_SITE_OTHER): Payer: Medicare Other | Admitting: Internal Medicine

## 2015-05-03 VITALS — BP 146/86 | HR 60 | Ht 63.25 in | Wt 169.4 lb

## 2015-05-03 DIAGNOSIS — R195 Other fecal abnormalities: Secondary | ICD-10-CM | POA: Diagnosis not present

## 2015-05-03 DIAGNOSIS — Z7902 Long term (current) use of antithrombotics/antiplatelets: Secondary | ICD-10-CM

## 2015-05-03 DIAGNOSIS — G459 Transient cerebral ischemic attack, unspecified: Secondary | ICD-10-CM | POA: Diagnosis not present

## 2015-05-03 MED ORDER — NA SULFATE-K SULFATE-MG SULF 17.5-3.13-1.6 GM/177ML PO SOLN: 1.0000 | Freq: Once | ORAL | Status: DC

## 2015-05-03 NOTE — Progress Notes (Signed)
HISTORY OF PRESENT ILLNESS:  Sarah Graves is a 81 y.o. female who is sent today in consultation by her primary care provider Dr. Leanna Battles with chief complaint of Hemoccult-positive stool. Patient is not been seen by GI previously. Review of outside records shows Hemoccult-positive stool 02/14/2015. Review of other blood work reveals normal hemoglobin of 13.2 and MCV 98.8. Unremarkable comprehensive metabolic panel. Patient has been anxious regarding this result. She denies melena or hematochezia. GI review of systems is entirely negative except for rare constipation with dietary indiscretion and hemorrhoids without bleeding. Mother has a history of breast cancer. Father history of esophagus cancer. The patient has not had prior GI evaluations. She does take regular aspirin and Plavix for remote history of TIAs.  REVIEW OF SYSTEMS:  All non-GI ROS negative except for sleeping problems  Past Medical History  Diagnosis Date  . Hypertension   . Stroke   . COPD (chronic obstructive pulmonary disease)   . Shingles   . Obesity   . Depression   . Hyperlipidemia   . Cerebrovascular disease   . Emphysema lung   . Osteoporosis   . PE (physical exam), annual     Past Surgical History  Procedure Laterality Date  . Tonsillectomy      Social History Sarah Graves  reports that she quit smoking about 6 years ago. Her smoking use included Cigarettes. She has never used smokeless tobacco. She reports that she does not drink alcohol or use illicit drugs.  family history includes Breast cancer in her mother; Esophageal cancer in her father; Heart attack (age of onset: 71) in her paternal uncle; Lung cancer in her father; Lung disease in her father; Stroke in her maternal uncle and mother.  Allergies  Allergen Reactions  . Motrin [Ibuprofen] Other (See Comments)    Skin bumps-not rash       PHYSICAL EXAMINATION: Vital signs: BP 146/86 mmHg  Pulse 60  Ht 5' 3.25" (1.607 m)  Wt 169 lb 6  oz (76.828 kg)  BMI 29.75 kg/m2  Constitutional: generally well-appearing, no acute distress Psychiatric: alert and oriented x3, cooperative Eyes: extraocular movements intact, anicteric, conjunctiva pink Mouth: oral pharynx moist, no lesions Neck: supple no lymphadenopathy Cardiovascular: heart regular rate and rhythm, no murmur Lungs: clear to auscultation bilaterally Abdomen: soft, obese, nontender, nondistended, no obvious ascites, no peritoneal signs, normal bowel sounds, no organomegaly Rectal: Deferred until colonoscopy Extremities: no lower extremity edema bilaterally Skin: no lesions on visible extremities Neuro: No focal deficits. No asterixis.    ASSESSMENT:  #1. A symptomatically Hemoccult-positive stool #2. Remote history of TIAs on aspirin and Plavix  PLAN:  #1. We discussed possible pathology tubes placed Hemoccult-positive stool. We discussed false-positive results. We discussed workup options in great detail including optical colonoscopy, virtual colonoscopy, and barium enema. Cologuard is not appropriate in this setting. To this end, she was interested in optical colonoscopy. She is high-risk given the need to address Plavix therapy. We will plan on holding Plavix 5 days prior to the examination but continuing her on aspirin for cerebrovascular protection. We will confirm except ability of this with her primary care provider. We did discuss the risks of the procedure on Plavix versus off Plavix. She understands.The nature of the procedure, as well as the risks, benefits, and alternatives were carefully and thoroughly reviewed with the patient. Ample time for discussion and questions allowed. The patient understood, was satisfied, and agreed to proceed.  A copy has been sent to Dr. Philip Aspen

## 2015-05-03 NOTE — Patient Instructions (Signed)
You have been scheduled for a colonoscopy. Please follow written instructions given to you at your visit today.  Please pick up your prep supplies at the pharmacy within the next 1-3 days. If you use inhalers (even only as needed), please bring them with you on the day of your procedure.   Stay on your aspirin

## 2015-05-05 ENCOUNTER — Telehealth: Payer: Self-pay

## 2015-05-05 NOTE — Telephone Encounter (Signed)
  05/05/2015   RE: Sarah Graves DOB: Oct 18, 1932 MRN: [id number]   Dear Dr. Philip Aspen,    We have scheduled the above patient for an endoscopic procedure. Our records show that she is on anticoagulation therapy.   Please advise as to how long the patient may come off her therapy of Plavix prior to the procedure, which is scheduled for 07/12/2015.  Please fax back/ or route the completed form to [place] at [phone].   Sincerely,    Phillis Haggis

## 2015-05-10 NOTE — Telephone Encounter (Signed)
Left message that I could give patient a sample prep.

## 2015-05-13 NOTE — Telephone Encounter (Signed)
Told patient I would leave a sample up front for her to pick up;  Will follow up with Dr. Philip Aspen regarding stopping Plavix

## 2015-05-17 NOTE — Telephone Encounter (Signed)
Patient picked up sample prep

## 2015-05-31 NOTE — Telephone Encounter (Signed)
Left message with Lattie Haw, who is taking calls for Dr. Philip Aspen, asking for status of anticoagulation letter regarding Plavix.

## 2015-06-07 NOTE — Telephone Encounter (Signed)
Lm with patient that, per Dr. Philip Aspen, she should hold her Plavix for one week prior to procedure.  Requested that she call me back to verify that she received and understood the message.

## 2015-06-10 ENCOUNTER — Telehealth: Payer: Self-pay

## 2015-06-10 NOTE — Telephone Encounter (Signed)
Patient returned my call to let me know she was aware of how long to hold her Plavix.  She wanted it noted that, because of her emphysema, at times when the weather is very hot she has trouble breathing, her head gets stopped up, etc.  Patient does not use an inhaler or nasal spray so I told her that if she was having problems the day of the procedure to let someone know when she got here.  Patient agreed.

## 2015-07-06 ENCOUNTER — Telehealth: Payer: Self-pay | Admitting: Internal Medicine

## 2015-07-06 NOTE — Telephone Encounter (Signed)
Reviewed prep instructions and holding plavix for 1 week prior to procedure. Pt aware,

## 2015-07-12 ENCOUNTER — Ambulatory Visit (AMBULATORY_SURGERY_CENTER): Payer: Medicare Other | Admitting: Internal Medicine

## 2015-07-12 ENCOUNTER — Encounter: Payer: Self-pay | Admitting: Internal Medicine

## 2015-07-12 VITALS — BP 154/70 | HR 52 | Temp 97.8°F | Resp 16 | Ht 63.25 in | Wt 169.0 lb

## 2015-07-12 DIAGNOSIS — D122 Benign neoplasm of ascending colon: Secondary | ICD-10-CM | POA: Diagnosis not present

## 2015-07-12 DIAGNOSIS — R195 Other fecal abnormalities: Secondary | ICD-10-CM | POA: Diagnosis present

## 2015-07-12 MED ORDER — SODIUM CHLORIDE 0.9 % IV SOLN: 500.0000 mL | INTRAVENOUS | Status: DC

## 2015-07-12 NOTE — Progress Notes (Signed)
Pt. Sneezed and coughed almost constantly in recovery.  Coughs up clear, thin liquid.

## 2015-07-12 NOTE — Progress Notes (Signed)
Transferred to recovery room. A/O x3, pleased with MAC.  VSS.  Report to Jane, RN. 

## 2015-07-12 NOTE — Patient Instructions (Signed)
YOU HAD AN ENDOSCOPIC PROCEDURE TODAY AT [facility]:   Refer to the procedure report that was given to you for any specific questions about what was found during the examination.  If the procedure report does not answer your questions, please call your gastroenterologist to clarify.  If you requested that your care partner not be given the details of your procedure findings, then the procedure report has been included in a sealed envelope for you to review at your convenience later.  YOU SHOULD EXPECT: Some feelings of bloating in the abdomen. Passage of more gas than usual.  Walking can help get rid of the air that was put into your GI tract during the procedure and reduce the bloating. If you had a lower endoscopy (such as a colonoscopy or flexible sigmoidoscopy) you may notice spotting of blood in your stool or on the toilet paper. If you underwent a bowel prep for your procedure, you may not have a normal bowel movement for a few days.  Please Note:  You might notice some irritation and congestion in your nose or some drainage.  This is from the oxygen used during your procedure.  There is no need for concern and it should clear up in a day or so.  SYMPTOMS TO REPORT IMMEDIATELY:   Following lower endoscopy (colonoscopy or flexible sigmoidoscopy):  Excessive amounts of blood in the stool  Significant tenderness or worsening of abdominal pains  Swelling of the abdomen that is new, acute  Fever of 100F or higher  For urgent or emergent issues, a gastroenterologist can be reached at any hour by calling [phone].   DIET: Your first meal following the procedure should be a small meal and then it is ok to progress to your normal diet. Heavy or fried foods are harder to digest and may make you feel nauseous or bloated.  Likewise, meals heavy in dairy and vegetables can increase bloating.  Drink plenty of fluids but you should avoid alcoholic beverages for 24  hours.  ACTIVITY:  You should plan to take it easy for the rest of today and you should NOT DRIVE or use heavy machinery until tomorrow (because of the sedation medicines used during the test).    FOLLOW UP: Our staff will call the number listed on your records the next business day following your procedure to check on you and address any questions or concerns that you may have regarding the information given to you following your procedure. If we do not reach you, we will leave a message.  However, if you are feeling well and you are not experiencing any problems, there is no need to return our call.  We will assume that you have returned to your regular daily activities without incident.  If any biopsies were taken you will be contacted by phone or by letter within the next 1-3 weeks.  Please call us at [phone] if you have not heard about the biopsies in 3 weeks.    SIGNATURES/CONFIDENTIALITY: You and/or your care partner have signed paperwork which will be entered into your electronic medical record.  These signatures attest to the fact that that the information above on your After Visit Summary has been reviewed and is understood.  Full responsibility of the confidentiality of this discharge information lies with you and/or your care-partner.  Polyps and diverticulosis, and hemorrhoid information given.  Resume Plavix today.

## 2015-07-12 NOTE — Op Note (Signed)
[place]  [address] [place], [id number]   COLONOSCOPY PROCEDURE REPORT  PATIENT: Sarah Graves, [place]  MR#: [id number] BIRTHDATE: 08-27-1933 , 40  yrs. old GENDER: female ENDOSCOPIST: Eustace Quail, MD REFERRED TX:[id number] Philip Aspen, M.D. PROCEDURE DATE:  07/12/2015 PROCEDURE:   Colonoscopy, diagnostic and Colonoscopy with snare polypectomy x 1 First Screening Colonoscopy - Avg.  risk and is 50 yrs.  old or older - No.  Prior Negative Screening - Now for repeat screening. N/A  History of Adenoma - Now for follow-up colonoscopy & has been > or = to 3 yrs.  N/A  Polyps removed today? Yes ASA CLASS:   Class III INDICATIONS:Evaluation of unexplained GI bleeding(heme positive stool); normal hemoglobin, asymptomatic. First colonoscopy. MEDICATIONS: Monitored anesthesia care and Propofol 200 mg IV  DESCRIPTION OF PROCEDURE:   After the risks benefits and alternatives of the procedure were thoroughly explained, informed consent was obtained.  The digital rectal exam revealed no abnormalities of the rectum.   The [id number]  endoscope was introduced through the anus and advanced to the cecum, which was identified by both the appendix and ileocecal valve. No adverse events experienced.   The quality of the prep was excellent. (Suprep was used)  The instrument was then slowly withdrawn as the colon was fully examined. Estimated blood loss is zero unless otherwise noted in this procedure report.   COLON FINDINGS: A single polyp measuring 2 mm in size was found in the ascending colon.  A polypectomy was performed with a cold snare.  The resection was complete, the polyp tissue was completely retrieved and sent to histology.   There was moderate diverticulosis noted in the left colon.   The examination was otherwise normal.  Retroflexed views revealed internal hemorrhoids. The time to cecum = 4.3 Withdrawal time = 11.0   The scope was withdrawn and the procedure  completed. COMPLICATIONS: There were no immediate complications.  ENDOSCOPIC IMPRESSION: 1.   Single polyp was found in the ascending colon; polypectomy was performed with a cold snare 2.   Moderate diverticulosis was noted in the left colon 3.   The examination was otherwise normal  RECOMMENDATIONS: 1.  Resume Plavix today 2.  Return to the care of your primary provider.  GI follow up as needed  eSigned:  Eustace Quail, MD 07/12/2015 11:19 AM   cc: The Patient and Leanna Battles, MD

## 2015-07-12 NOTE — Progress Notes (Signed)
Called to room to assist during endoscopic procedure.  Patient ID and intended procedure confirmed with present staff. Received instructions for my participation in the procedure from the performing physician.  

## 2015-07-13 ENCOUNTER — Telehealth: Payer: Self-pay | Admitting: *Deleted

## 2015-07-13 NOTE — Telephone Encounter (Signed)
  Follow up Call-  Call back number 07/12/2015  Post procedure Call Back phone  # [phone]  Permission to leave phone message Yes     Patient questions:  Do you have a fever, pain , or abdominal swelling? No. Pain Score  0 *  Have you tolerated food without any problems? Yes.    Have you been able to return to your normal activities? Yes.    Do you have any questions about your discharge instructions: Diet   No. Medications  No. Follow up visit  No.  Do you have questions or concerns about your Care? No.  Actions: * If pain score is 4 or above:pt did c/o her nose running and some congestion in recovery room yesterday before she left and this is the same today. Pt. Said she was told yesterday that this was most likely due to the oxygen . No action needed, pain <4.

## 2015-07-18 ENCOUNTER — Encounter: Payer: Self-pay | Admitting: Internal Medicine

## 2016-02-08 DIAGNOSIS — E784 Other hyperlipidemia: Secondary | ICD-10-CM | POA: Diagnosis not present

## 2016-02-08 DIAGNOSIS — N390 Urinary tract infection, site not specified: Secondary | ICD-10-CM | POA: Diagnosis not present

## 2016-02-08 DIAGNOSIS — I10 Essential (primary) hypertension: Secondary | ICD-10-CM | POA: Diagnosis not present

## 2016-02-08 DIAGNOSIS — M810 Age-related osteoporosis without current pathological fracture: Secondary | ICD-10-CM | POA: Diagnosis not present

## 2016-02-08 DIAGNOSIS — R8299 Other abnormal findings in urine: Secondary | ICD-10-CM | POA: Diagnosis not present

## 2016-07-27 DIAGNOSIS — Z6830 Body mass index (BMI) 30.0-30.9, adult: Secondary | ICD-10-CM | POA: Diagnosis not present

## 2016-07-27 DIAGNOSIS — E784 Other hyperlipidemia: Secondary | ICD-10-CM | POA: Diagnosis not present

## 2016-07-27 DIAGNOSIS — I10 Essential (primary) hypertension: Secondary | ICD-10-CM | POA: Diagnosis not present

## 2016-07-27 DIAGNOSIS — Z23 Encounter for immunization: Secondary | ICD-10-CM | POA: Diagnosis not present

## 2016-07-27 DIAGNOSIS — I679 Cerebrovascular disease, unspecified: Secondary | ICD-10-CM | POA: Diagnosis not present

## 2016-07-27 DIAGNOSIS — M810 Age-related osteoporosis without current pathological fracture: Secondary | ICD-10-CM | POA: Diagnosis not present

## 2016-07-27 DIAGNOSIS — G459 Transient cerebral ischemic attack, unspecified: Secondary | ICD-10-CM | POA: Diagnosis not present

## 2016-07-27 DIAGNOSIS — J439 Emphysema, unspecified: Secondary | ICD-10-CM | POA: Diagnosis not present

## 2017-04-12 DIAGNOSIS — I10 Essential (primary) hypertension: Secondary | ICD-10-CM | POA: Diagnosis not present

## 2017-04-12 DIAGNOSIS — G459 Transient cerebral ischemic attack, unspecified: Secondary | ICD-10-CM | POA: Diagnosis not present

## 2017-04-12 DIAGNOSIS — E784 Other hyperlipidemia: Secondary | ICD-10-CM | POA: Diagnosis not present

## 2017-04-12 DIAGNOSIS — F329 Major depressive disorder, single episode, unspecified: Secondary | ICD-10-CM | POA: Diagnosis not present

## 2017-12-03 DIAGNOSIS — I10 Essential (primary) hypertension: Secondary | ICD-10-CM | POA: Diagnosis not present

## 2017-12-03 DIAGNOSIS — R600 Localized edema: Secondary | ICD-10-CM | POA: Diagnosis not present

## 2017-12-03 DIAGNOSIS — I6529 Occlusion and stenosis of unspecified carotid artery: Secondary | ICD-10-CM | POA: Diagnosis not present

## 2017-12-03 DIAGNOSIS — J439 Emphysema, unspecified: Secondary | ICD-10-CM | POA: Diagnosis not present

## 2018-04-18 DIAGNOSIS — J019 Acute sinusitis, unspecified: Secondary | ICD-10-CM | POA: Diagnosis not present

## 2018-04-18 DIAGNOSIS — R0602 Shortness of breath: Secondary | ICD-10-CM | POA: Diagnosis not present

## 2018-04-18 DIAGNOSIS — J181 Lobar pneumonia, unspecified organism: Secondary | ICD-10-CM | POA: Diagnosis not present

## 2018-04-18 DIAGNOSIS — Z6831 Body mass index (BMI) 31.0-31.9, adult: Secondary | ICD-10-CM | POA: Diagnosis not present

## 2018-06-06 DIAGNOSIS — Z6831 Body mass index (BMI) 31.0-31.9, adult: Secondary | ICD-10-CM | POA: Diagnosis not present

## 2018-06-06 DIAGNOSIS — I10 Essential (primary) hypertension: Secondary | ICD-10-CM | POA: Diagnosis not present

## 2018-06-06 DIAGNOSIS — J181 Lobar pneumonia, unspecified organism: Secondary | ICD-10-CM | POA: Diagnosis not present

## 2018-06-06 DIAGNOSIS — R600 Localized edema: Secondary | ICD-10-CM | POA: Diagnosis not present

## 2018-06-21 ENCOUNTER — Emergency Department (HOSPITAL_COMMUNITY)
Admission: EM | Admit: 2018-06-21 | Discharge: 2018-06-22 | Disposition: A | Discharge status: Home / Self Care | Payer: Medicare Other | Attending: Emergency Medicine | Admitting: Emergency Medicine

## 2018-06-21 ENCOUNTER — Encounter (HOSPITAL_COMMUNITY): Payer: Self-pay | Admitting: Emergency Medicine

## 2018-06-21 ENCOUNTER — Emergency Department (HOSPITAL_COMMUNITY): Payer: Medicare Other

## 2018-06-21 DIAGNOSIS — Z7902 Long term (current) use of antithrombotics/antiplatelets: Secondary | ICD-10-CM | POA: Insufficient documentation

## 2018-06-21 DIAGNOSIS — Z7982 Long term (current) use of aspirin: Secondary | ICD-10-CM | POA: Insufficient documentation

## 2018-06-21 DIAGNOSIS — J189 Pneumonia, unspecified organism: Secondary | ICD-10-CM | POA: Diagnosis not present

## 2018-06-21 DIAGNOSIS — I10 Essential (primary) hypertension: Secondary | ICD-10-CM | POA: Insufficient documentation

## 2018-06-21 DIAGNOSIS — Z87891 Personal history of nicotine dependence: Secondary | ICD-10-CM | POA: Insufficient documentation

## 2018-06-21 DIAGNOSIS — R110 Nausea: Secondary | ICD-10-CM | POA: Diagnosis not present

## 2018-06-21 DIAGNOSIS — R0602 Shortness of breath: Secondary | ICD-10-CM | POA: Diagnosis not present

## 2018-06-21 DIAGNOSIS — Z79899 Other long term (current) drug therapy: Secondary | ICD-10-CM | POA: Diagnosis not present

## 2018-06-21 DIAGNOSIS — J181 Lobar pneumonia, unspecified organism: Secondary | ICD-10-CM

## 2018-06-21 DIAGNOSIS — R531 Weakness: Secondary | ICD-10-CM | POA: Diagnosis not present

## 2018-06-21 DIAGNOSIS — J449 Chronic obstructive pulmonary disease, unspecified: Secondary | ICD-10-CM | POA: Diagnosis not present

## 2018-06-21 LAB — BASIC METABOLIC PANEL
ANION GAP: 11 (ref 5–15)
BUN: 18 mg/dL (ref 8–23)
CALCIUM: 9.5 mg/dL (ref 8.9–10.3)
CO2: 25 mmol/L (ref 22–32)
Chloride: 104 mmol/L (ref 98–111)
Creatinine, Ser: 0.82 mg/dL (ref 0.44–1.00)
GFR calc Af Amer: >60 mL/min (ref >60)
GFR calc non Af Amer: >60 mL/min (ref >60)
GLUCOSE: 116 mg/dL — AB (ref 70–99)
POTASSIUM: 3.4 mmol/L — AB (ref 3.5–5.1)
Sodium: 140 mmol/L (ref 135–145)

## 2018-06-21 LAB — CBC WITH DIFFERENTIAL/PLATELET
Abs Immature Granulocytes: 0.1 10*3/uL (ref 0.0–0.1)
Basophils Absolute: 0.1 10*3/uL (ref 0.0–0.1)
Basophils Relative: 1 %
EOS ABS: 0.2 10*3/uL (ref 0.0–0.7)
Eosinophils Relative: 2 %
HEMATOCRIT: 39.5 % (ref 36.0–46.0)
Hemoglobin: 12.9 g/dL (ref 12.0–15.0)
IMMATURE GRANULOCYTES: 1 %
LYMPHS ABS: 1.7 10*3/uL (ref 0.7–4.0)
Lymphocytes Relative: 16 %
MCH: 31.2 pg (ref 26.0–34.0)
MCHC: 32.7 g/dL (ref 30.0–36.0)
MCV: 95.4 fL (ref 78.0–100.0)
MONO ABS: 0.7 10*3/uL (ref 0.1–1.0)
MONOS PCT: 7 %
Neutro Abs: 7.6 10*3/uL (ref 1.7–7.7)
Neutrophils Relative %: 73 %
Platelets: 255 10*3/uL (ref 150–400)
RBC: 4.14 MIL/uL (ref 3.87–5.11)
RDW: 13.2 % (ref 11.5–15.5)
WBC: 10.3 10*3/uL (ref 4.0–10.5)

## 2018-06-21 LAB — I-STAT TROPONIN, ED: Troponin i, poc: 0 ng/mL (ref 0.00–0.08)

## 2018-06-21 LAB — HEPATIC FUNCTION PANEL
ALBUMIN: 3.7 g/dL (ref 3.5–5.0)
ALK PHOS: 54 U/L (ref 38–126)
ALT: 20 U/L (ref 0–44)
AST: 28 U/L (ref 15–41)
Bilirubin, Direct: <0.1 mg/dL (ref 0.0–0.2)
TOTAL PROTEIN: 6.5 g/dL (ref 6.5–8.1)
Total Bilirubin: 0.7 mg/dL (ref 0.3–1.2)

## 2018-06-21 LAB — LIPASE, BLOOD: Lipase: 38 U/L (ref 11–51)

## 2018-06-21 MED ORDER — SODIUM CHLORIDE 0.9 % IV BOLUS
500.0000 mL | Freq: Once | INTRAVENOUS | Status: AC
  Administered 2018-06-21: 500 mL via INTRAVENOUS

## 2018-06-21 MED ORDER — ONDANSETRON HCL 4 MG/2ML IJ SOLN
4.0000 mg | Freq: Once | INTRAMUSCULAR | Status: AC
  Administered 2018-06-21: 4 mg via INTRAVENOUS
  Filled 2018-06-21: qty 2

## 2018-06-21 NOTE — ED Triage Notes (Signed)
[facility] reports pt has N and generalized weakness, sob Hx TIA Reported pt took atenolol just before calling EMS.  198/96, hr 60, 96% RA, cbg 102,

## 2018-06-21 NOTE — ED Notes (Signed)
Patient transported to X-ray 

## 2018-06-21 NOTE — ED Provider Notes (Signed)
[facility] EMERGENCY DEPARTMENT Provider Note   CSN: [id number] Arrival date & time: 06/21/18  2157     History   Chief Complaint Chief Complaint  Patient presents with  . Nausea    HPI Sarah Graves is a 84 y.o. female.  The history is provided by the patient and medical records.     84 y.o. F with hx of COPD, depression, emphysema, HLP, HTN, osteoporosis, prior stroke, presenting to the ED for nausea.  Patient reports symptoms were of sudden onset about 20 mins prior to calling EMS.  She had associated SOB during this, seems to have resolves now but still very nauseated.  States she threw up once on the way here, mostly clear liquid.  She denies any abdominal pain.  No recent diarrhea or sick contacts.  No fever/chills.  No cough, nasal congestion, or other URI symptoms.  No chest pain.  No known cardiac history, former smoker.  She did take her night time atenolol prior to coming to ED.    Past Medical History:  Diagnosis Date  . Cerebrovascular disease   . COPD (chronic obstructive pulmonary disease) ([place])   . Depression   . Emphysema lung ([place])   . Hyperlipidemia   . Hypertension   . Obesity   . Osteoporosis   . PE (physical exam), annual   . Shingles   . Stroke [facility])     Patient Active Problem List   Diagnosis Date Noted  . TIA (transient ischemic attack) 02/12/2013  . Hyperlipidemia 02/12/2013  . CHEST PAIN-PRECORDIAL 12/05/2009  . VITAMIN D DEFICIENCY 12/02/2009  . Essential hypertension, benign 12/02/2009  . PE 12/02/2009  . CEREBROVASCULAR DISEASE 12/02/2009  . EMPHYSEMA 12/02/2009  . OSTEOPOROSIS 12/02/2009  . DYSPNEA ON EXERTION 12/02/2009  . CHEST PAIN, ATYPICAL 12/02/2009  . TOBACCO USE, QUIT 12/02/2009    Past Surgical History:  Procedure Laterality Date  . TONSILLECTOMY       OB History   None      Home Medications    Prior to Admission medications   Medication Sig Start Date End Date Taking? Authorizing Provider    aspirin 325 MG tablet Take 325 mg by mouth every evening.     [provider]  atenolol (TENORMIN) 50 MG tablet Take 50 mg by mouth every evening. May take additional dose if BP is high    [provider]  Calcium Carbonate-Vitamin D (OSCAL 500/200 D-3 PO) Take 1 tablet by mouth daily.    [provider]  clopidogrel (PLAVIX) 75 MG tablet Take 75 mg by mouth every evening.     [provider]  Multiple Vitamins-Minerals (CENTRUM SILVER PO) Take 1 tablet by mouth daily.    [provider]  pravastatin (PRAVACHOL) 20 MG tablet Take 20 mg by mouth every evening.     [provider]  valsartan-hydrochlorothiazide (DIOVAN-HCT) 160-25 MG per tablet Take 1 tablet by mouth every evening.     [provider]    Family History Family History  Problem Relation Age of Onset  . Lung disease Father   . Lung cancer Father   . Stroke Mother   . Breast cancer Mother   . Esophageal cancer Father   . Stroke Maternal Uncle        x 2  . Heart attack Paternal Uncle 23       x 2    Social History Social History   Tobacco Use  . Smoking status: Former  Smoker    Types: Cigarettes    Last attempt to quit: 12/03/2008    Years since quitting: 9.5  . Smokeless tobacco: Never Used  Substance Use Topics  . Alcohol use: No    Alcohol/week: 0.0 standard drinks  . Drug use: No     Allergies   Motrin [ibuprofen] and Tramadol   Review of Systems Review of Systems  Respiratory: Positive for shortness of breath.   Gastrointestinal: Positive for nausea.  All other systems reviewed and are negative.    Physical Exam Updated Vital Signs BP (!) 171/74   Pulse (!) 54   Temp 98.2 F (36.8 C) (Oral)   Resp 20   Ht 5' 4.5" (1.638 m)   Wt 79.4 kg   SpO2 96%   BMI 29.57 kg/m   Physical Exam  Constitutional: She is oriented to person, place, and time. She appears well-developed and well-nourished.  Appears pale  HENT:  Head:  Normocephalic and atraumatic.  Mouth/Throat: Oropharynx is clear and moist.  Eyes: Pupils are equal, round, and reactive to light. Conjunctivae and EOM are normal.  Neck: Normal range of motion.  Cardiovascular: Normal rate, regular rhythm and normal heart sounds.  Pulmonary/Chest: Effort normal and breath sounds normal.  Abdominal: Soft. Bowel sounds are normal. There is no tenderness. There is no rigidity, no guarding and negative Murphy's sign.  Musculoskeletal: Normal range of motion.  Some pitting edema of the legs, no weeping or signs of infection  Neurological: She is alert and oriented to person, place, and time.  Skin: Skin is warm and dry.  Psychiatric: She has a normal mood and affect.  Nursing note and vitals reviewed.    ED Treatments / Results  Labs (all labs ordered are listed, but only abnormal results are displayed) Labs Reviewed  BASIC METABOLIC PANEL - Abnormal; Notable for the following components:      Result Value   Potassium 3.4 (*)    Glucose, Bld 116 (*)    All other components within normal limits  CBC WITH DIFFERENTIAL/PLATELET  HEPATIC FUNCTION PANEL  LIPASE, BLOOD  I-STAT TROPONIN, ED    EKG EKG Interpretation  Date/Time:  Saturday June 21 2018 22:06:33 EDT Ventricular Rate:  57 PR Interval:    QRS Duration: 85 QT Interval:  452 QTC Calculation: 441 R Axis:   -3 Text Interpretation:  Sinus rhythm Low voltage, precordial leads Probable anteroseptal infarct, old Borderline repolarization abnormality No significant change since last tracing Confirmed by Deno Etienne [phone]) on 06/21/2018 10:24:13 PM   Radiology Dg Chest 2 View  Result Date: 06/21/2018 CLINICAL DATA:  Patient with shortness of breath. EXAM: CHEST - 2 VIEW COMPARISON:  Chest radiograph 02/14/2011 FINDINGS: Monitoring leads overlie the patient. Stable cardiac and mediastinal contours. Heterogeneous opacities left lung base. No pleural effusion or pneumothorax. Thoracic spine  degenerative changes. IMPRESSION: Heterogeneous opacities left lung base may represent atelectasis or infection. Electronically Signed   By: Lovey Newcomer M.D.   On: 06/21/2018 22:44    Procedures Procedures (including critical care time)  Medications Ordered in ED Medications  sodium chloride 0.9 % bolus 500 mL (0 mLs Intravenous Stopped 06/22/18 0047)  ondansetron (ZOFRAN) injection 4 mg (4 mg Intravenous Given 06/21/18 2253)  ondansetron (ZOFRAN) injection 4 mg (4 mg Intravenous Given 06/22/18 0047)     Initial Impression / Assessment and Plan / ED Course  I have reviewed the triage vital signs and the nursing notes.  Pertinent labs & imaging results that were available  during my care of the patient were reviewed by me and considered in my medical decision making (see chart for details).  84 year old female presenting to the ED with nausea, onset 20 minutes prior to calling EMS.  Took evening dose of atenolol just prior to arrival.  Emesis x1 with EMS, nonbloody and nonbilious.  She denies any chest pain, abdominal pain, diarrhea, changes in medications, travel, or sick contacts.  She is afebrile and nontoxic, does appear pale.  Abdomen is soft and benign.  Lungs are overall clear without wheezes or rhonchi, no acute distress.  Plan for EKG, labs, CXR.  Given small fluid bolus and zofran.  EKG NSR, no acute ischemic changes.  Trop negative.  Other labs reassuring.  CXR with concern for possible pneumonia.  Discussed results with patient and family at the bedside, reports pneumonia about a month ago treated with antibiotics by primary care doctor.  Family also with some concern of some pitting edema of the legs.  She is on a combination blood pressure medication but no additional diuretics.  Does have some mild pitting edema of the legs but no weeping or signs of infection.  No signs of significant fluid overload or CHF otherwise.   VS remain stable.  Tolerating PO well here without issue.  Appears  stable for discharge. Recommended compression stockings, watch sodium intake, elevate legs when possible.  Will start doxycycline for possible pneumonia seen on CXR.  Close follow-up with PCP.  Discussed plan with patient and family, they acknowledged understanding and agreed with plan of care.  Return precautions given for new or worsening symptoms.  Discussed with attending physician, Dr. Tyrone Nine-- agrees with assessment, management, and plan of care.  Final Clinical Impressions(s) / ED Diagnoses   Final diagnoses:  Nausea  Community acquired pneumonia of left lower lobe of lung [facility])    ED Discharge Orders    None       Larene Pickett, PA-C 06/22/18 [place], [place], DO 06/23/18 0020

## 2018-06-22 MED ORDER — ONDANSETRON 4 MG PO TBDP: 4.0000 mg | ORAL_TABLET | Freq: Three times a day (TID) | ORAL | 0 refills | Status: AC | PRN

## 2018-06-22 MED ORDER — DOXYCYCLINE HYCLATE 100 MG PO CAPS: 100.0000 mg | ORAL_CAPSULE | Freq: Two times a day (BID) | ORAL | 0 refills | Status: DC

## 2018-06-22 MED ORDER — ONDANSETRON HCL 4 MG/2ML IJ SOLN
4.0000 mg | Freq: Once | INTRAMUSCULAR | Status: AC
  Administered 2018-06-22: 4 mg via INTRAVENOUS
  Filled 2018-06-22: qty 2

## 2018-06-22 NOTE — ED Notes (Signed)
Patient drinking water for PO challenge; tolerating well. No episodes of vomiting.

## 2018-06-22 NOTE — ED Notes (Signed)
Patient's daughter requested for [place] to assess patient's legs due to swelling. (+) 2 Edema noted on bilateral lower extremities. Per patient, she is currently being treated with diuretics. Provider notified.

## 2018-06-22 NOTE — Discharge Instructions (Signed)
Take the prescribed medication as directed. Recommend monitor your salt intake at home, wear compression stockings to help with swelling of the legs. Follow-up with your primary care doctor. Return to the ED for new or worsening symptoms.

## 2018-06-27 DIAGNOSIS — I10 Essential (primary) hypertension: Secondary | ICD-10-CM | POA: Diagnosis not present

## 2018-06-27 DIAGNOSIS — R600 Localized edema: Secondary | ICD-10-CM | POA: Diagnosis not present

## 2018-06-27 DIAGNOSIS — I872 Venous insufficiency (chronic) (peripheral): Secondary | ICD-10-CM | POA: Diagnosis not present

## 2018-06-27 DIAGNOSIS — J189 Pneumonia, unspecified organism: Secondary | ICD-10-CM | POA: Diagnosis not present

## 2018-09-02 DIAGNOSIS — Z23 Encounter for immunization: Secondary | ICD-10-CM | POA: Diagnosis not present

## 2018-10-15 DIAGNOSIS — M810 Age-related osteoporosis without current pathological fracture: Secondary | ICD-10-CM | POA: Diagnosis not present

## 2018-10-15 DIAGNOSIS — E7849 Other hyperlipidemia: Secondary | ICD-10-CM | POA: Diagnosis not present

## 2018-10-15 DIAGNOSIS — I10 Essential (primary) hypertension: Secondary | ICD-10-CM | POA: Diagnosis not present

## 2018-10-15 DIAGNOSIS — R82998 Other abnormal findings in urine: Secondary | ICD-10-CM | POA: Diagnosis not present

## 2018-10-17 DIAGNOSIS — I10 Essential (primary) hypertension: Secondary | ICD-10-CM | POA: Diagnosis not present

## 2018-10-17 DIAGNOSIS — Z0000 Encounter for general adult medical examination without abnormal findings: Secondary | ICD-10-CM | POA: Diagnosis not present

## 2018-10-17 DIAGNOSIS — J189 Pneumonia, unspecified organism: Secondary | ICD-10-CM | POA: Diagnosis not present

## 2018-10-17 DIAGNOSIS — I872 Venous insufficiency (chronic) (peripheral): Secondary | ICD-10-CM | POA: Diagnosis not present

## 2018-10-21 DIAGNOSIS — Z1212 Encounter for screening for malignant neoplasm of rectum: Secondary | ICD-10-CM | POA: Diagnosis not present

## 2019-11-10 DIAGNOSIS — Z1152 Encounter for screening for COVID-19: Secondary | ICD-10-CM | POA: Diagnosis not present

## 2019-11-10 DIAGNOSIS — J029 Acute pharyngitis, unspecified: Secondary | ICD-10-CM | POA: Diagnosis not present

## 2019-11-10 DIAGNOSIS — J439 Emphysema, unspecified: Secondary | ICD-10-CM | POA: Diagnosis not present

## 2019-11-10 DIAGNOSIS — J209 Acute bronchitis, unspecified: Secondary | ICD-10-CM | POA: Diagnosis not present

## 2019-11-19 DIAGNOSIS — J439 Emphysema, unspecified: Secondary | ICD-10-CM | POA: Diagnosis not present

## 2019-11-19 DIAGNOSIS — J209 Acute bronchitis, unspecified: Secondary | ICD-10-CM | POA: Diagnosis not present

## 2019-11-19 DIAGNOSIS — R509 Fever, unspecified: Secondary | ICD-10-CM | POA: Diagnosis not present

## 2020-11-04 DIAGNOSIS — Z23 Encounter for immunization: Secondary | ICD-10-CM | POA: Diagnosis not present

## 2020-11-04 DIAGNOSIS — J189 Pneumonia, unspecified organism: Secondary | ICD-10-CM | POA: Diagnosis not present

## 2020-11-04 DIAGNOSIS — J441 Chronic obstructive pulmonary disease with (acute) exacerbation: Secondary | ICD-10-CM | POA: Diagnosis not present

## 2020-11-04 DIAGNOSIS — J439 Emphysema, unspecified: Secondary | ICD-10-CM | POA: Diagnosis not present

## 2021-01-13 DIAGNOSIS — Z1231 Encounter for screening mammogram for malignant neoplasm of breast: Secondary | ICD-10-CM | POA: Diagnosis not present

## 2021-01-27 DIAGNOSIS — E785 Hyperlipidemia, unspecified: Secondary | ICD-10-CM | POA: Diagnosis not present

## 2021-01-27 DIAGNOSIS — M810 Age-related osteoporosis without current pathological fracture: Secondary | ICD-10-CM | POA: Diagnosis not present

## 2021-02-03 DIAGNOSIS — R82998 Other abnormal findings in urine: Secondary | ICD-10-CM | POA: Diagnosis not present

## 2021-03-17 DIAGNOSIS — I10 Essential (primary) hypertension: Secondary | ICD-10-CM | POA: Diagnosis not present

## 2021-03-17 DIAGNOSIS — N179 Acute kidney failure, unspecified: Secondary | ICD-10-CM | POA: Diagnosis not present

## 2021-03-17 DIAGNOSIS — R600 Localized edema: Secondary | ICD-10-CM | POA: Diagnosis not present

## 2021-03-17 DIAGNOSIS — I872 Venous insufficiency (chronic) (peripheral): Secondary | ICD-10-CM | POA: Diagnosis not present

## 2021-04-17 DIAGNOSIS — R300 Dysuria: Secondary | ICD-10-CM | POA: Diagnosis not present

## 2021-04-17 DIAGNOSIS — N390 Urinary tract infection, site not specified: Secondary | ICD-10-CM | POA: Diagnosis not present

## 2021-04-17 DIAGNOSIS — R350 Frequency of micturition: Secondary | ICD-10-CM | POA: Diagnosis not present

## 2021-05-05 DIAGNOSIS — I10 Essential (primary) hypertension: Secondary | ICD-10-CM | POA: Diagnosis not present

## 2021-05-05 DIAGNOSIS — N179 Acute kidney failure, unspecified: Secondary | ICD-10-CM | POA: Diagnosis not present

## 2021-05-05 DIAGNOSIS — R350 Frequency of micturition: Secondary | ICD-10-CM | POA: Diagnosis not present

## 2021-05-05 DIAGNOSIS — R600 Localized edema: Secondary | ICD-10-CM | POA: Diagnosis not present

## 2021-05-23 DIAGNOSIS — N179 Acute kidney failure, unspecified: Secondary | ICD-10-CM | POA: Diagnosis not present

## 2021-05-23 DIAGNOSIS — R350 Frequency of micturition: Secondary | ICD-10-CM | POA: Diagnosis not present

## 2021-05-23 DIAGNOSIS — R600 Localized edema: Secondary | ICD-10-CM | POA: Diagnosis not present

## 2021-05-23 DIAGNOSIS — I10 Essential (primary) hypertension: Secondary | ICD-10-CM | POA: Diagnosis not present

## 2021-06-07 ENCOUNTER — Emergency Department (HOSPITAL_COMMUNITY): Payer: Medicare Other

## 2021-06-07 ENCOUNTER — Emergency Department (HOSPITAL_COMMUNITY)
Admission: EM | Admit: 2021-06-07 | Discharge: 2021-06-07 | Disposition: A | Discharge status: Home / Self Care | Payer: Medicare Other | Attending: Emergency Medicine | Admitting: Emergency Medicine

## 2021-06-07 ENCOUNTER — Other Ambulatory Visit: Payer: Self-pay

## 2021-06-07 DIAGNOSIS — I10 Essential (primary) hypertension: Secondary | ICD-10-CM | POA: Diagnosis not present

## 2021-06-07 DIAGNOSIS — R42 Dizziness and giddiness: Secondary | ICD-10-CM | POA: Diagnosis not present

## 2021-06-07 DIAGNOSIS — R509 Fever, unspecified: Secondary | ICD-10-CM | POA: Insufficient documentation

## 2021-06-07 DIAGNOSIS — Z7982 Long term (current) use of aspirin: Secondary | ICD-10-CM | POA: Diagnosis not present

## 2021-06-07 DIAGNOSIS — J449 Chronic obstructive pulmonary disease, unspecified: Secondary | ICD-10-CM | POA: Insufficient documentation

## 2021-06-07 DIAGNOSIS — Z7902 Long term (current) use of antithrombotics/antiplatelets: Secondary | ICD-10-CM | POA: Diagnosis not present

## 2021-06-07 DIAGNOSIS — Z20822 Contact with and (suspected) exposure to covid-19: Secondary | ICD-10-CM | POA: Diagnosis not present

## 2021-06-07 DIAGNOSIS — F32A Depression, unspecified: Secondary | ICD-10-CM | POA: Diagnosis not present

## 2021-06-07 DIAGNOSIS — Z87891 Personal history of nicotine dependence: Secondary | ICD-10-CM | POA: Insufficient documentation

## 2021-06-07 DIAGNOSIS — Z79899 Other long term (current) drug therapy: Secondary | ICD-10-CM | POA: Insufficient documentation

## 2021-06-07 DIAGNOSIS — R531 Weakness: Secondary | ICD-10-CM | POA: Diagnosis not present

## 2021-06-07 LAB — CBC WITH DIFFERENTIAL/PLATELET
Abs Immature Granulocytes: 0.03 10*3/uL (ref 0.00–0.07)
Basophils Absolute: 0.1 10*3/uL (ref 0.0–0.1)
Basophils Relative: 1 %
Eosinophils Absolute: 0.3 10*3/uL (ref 0.0–0.5)
Eosinophils Relative: 4 %
HCT: 38.2 % (ref 36.0–46.0)
Hemoglobin: 12.1 g/dL (ref 12.0–15.0)
Immature Granulocytes: 0 %
Lymphocytes Relative: 14 %
Lymphs Abs: 1.2 10*3/uL (ref 0.7–4.0)
MCH: 30.3 pg (ref 26.0–34.0)
MCHC: 31.7 g/dL (ref 30.0–36.0)
MCV: 95.7 fL (ref 80.0–100.0)
Monocytes Absolute: 0.7 10*3/uL (ref 0.1–1.0)
Monocytes Relative: 8 %
Neutro Abs: 6.7 10*3/uL (ref 1.7–7.7)
Neutrophils Relative %: 73 %
Platelets: 310 10*3/uL (ref 150–400)
RBC: 3.99 MIL/uL (ref 3.87–5.11)
RDW: 13.1 % (ref 11.5–15.5)
WBC: 9.1 10*3/uL (ref 4.0–10.5)
nRBC: 0 % (ref 0.0–0.2)

## 2021-06-07 LAB — URINALYSIS, ROUTINE W REFLEX MICROSCOPIC
Bilirubin Urine: NEGATIVE
Glucose, UA: NEGATIVE mg/dL
Hgb urine dipstick: NEGATIVE
Ketones, ur: NEGATIVE mg/dL
Leukocytes,Ua: NEGATIVE
Nitrite: NEGATIVE
Protein, ur: NEGATIVE mg/dL
Specific Gravity, Urine: 1.003 — ABNORMAL LOW (ref 1.005–1.030)
pH: 7 (ref 5.0–8.0)

## 2021-06-07 LAB — COMPREHENSIVE METABOLIC PANEL
ALT: 31 U/L (ref 0–44)
AST: 41 U/L (ref 15–41)
Albumin: 3.3 g/dL — ABNORMAL LOW (ref 3.5–5.0)
Alkaline Phosphatase: 88 U/L (ref 38–126)
Anion gap: 14 (ref 5–15)
BUN: 9 mg/dL (ref 8–23)
CO2: 25 mmol/L (ref 22–32)
Calcium: 9.2 mg/dL (ref 8.9–10.3)
Chloride: 101 mmol/L (ref 98–111)
Creatinine, Ser: 0.92 mg/dL (ref 0.44–1.00)
GFR, Estimated: >60 mL/min (ref >60)
Glucose, Bld: 99 mg/dL (ref 70–99)
Potassium: 3.6 mmol/L (ref 3.5–5.1)
Sodium: 140 mmol/L (ref 135–145)
Total Bilirubin: 0.6 mg/dL (ref 0.3–1.2)
Total Protein: 6.6 g/dL (ref 6.5–8.1)

## 2021-06-07 LAB — RESP PANEL BY RT-PCR (FLU A&B, COVID) ARPGX2
Influenza A by PCR: NEGATIVE
Influenza B by PCR: NEGATIVE
SARS Coronavirus 2 by RT PCR: NEGATIVE

## 2021-06-07 LAB — LACTIC ACID, PLASMA: Lactic Acid, Venous: 1.5 mmol/L (ref 0.5–1.9)

## 2021-06-07 NOTE — ED Provider Notes (Signed)
[facility] EMERGENCY DEPARTMENT Provider Note  CSN: [id number] Arrival date & time: 06/07/21 1644    History Chief Complaint  Patient presents with   Hypertension   Weakness    Sarah Graves is a 87 y.o. female with history of HTN reports her BP has been elevated off and on for the last few months after her doctor began changing her medications due to CKD. She last saw him yesterday and started on a new medication but she is not sure the name. Today she noted her BP was very elevated so she took an extra Atenolol. She has not had any CP, SOB, N/V/D or dysuria. She felt shaky and weak earlier today with a low grade fever at home. EMS was called and she was given ASA prior to arrival.    Past Medical History:  Diagnosis Date   Cerebrovascular disease    COPD (chronic obstructive pulmonary disease) ([place])    Depression    Emphysema lung ([facility])    Hyperlipidemia    Hypertension    Obesity    Osteoporosis    PE (physical exam), annual    Shingles    Stroke [facility])     Past Surgical History:  Procedure Laterality Date   TONSILLECTOMY      Family History  Problem Relation Age of Onset   Lung disease Father    Lung cancer Father    Stroke Mother    Breast cancer Mother    Esophageal cancer Father    Stroke Maternal Uncle        x 2   Heart attack Paternal Uncle 19       x 2    Social History   Tobacco Use   Smoking status: Former    Types: Cigarettes    Quit date: 12/03/2008    Years since quitting: 12.5   Smokeless tobacco: Never  Substance Use Topics   Alcohol use: No    Alcohol/week: 0.0 standard drinks   Drug use: No     Home Medications Prior to Admission medications   Medication Sig Start Date End Date Taking? Authorizing Provider  aspirin 325 MG tablet Take 325 mg by mouth in the morning.   Yes [provider]  atenolol (TENORMIN) 50 MG tablet Take 50 mg by mouth See admin instructions. Take 50 mg by mouth once a day and an additional 50 mg if  blood pressure remains elevated   Yes [provider]  CALCIUM PO Take 1 tablet by mouth in the morning.   Yes [provider]  clopidogrel (PLAVIX) 75 MG tablet Take 75 mg by mouth in the morning.   Yes [provider]  furosemide (LASIX) 20 MG tablet Take 20 mg by mouth in the morning.   Yes [provider]  hydrALAZINE (APRESOLINE) 25 MG tablet Take 25 mg by mouth 3 (three) times daily.   Yes [provider]  losartan (COZAAR) 100 MG tablet Take 50 mg by mouth at bedtime.   Yes [provider]  Multiple Vitamins-Minerals (CENTRUM SILVER PO) Take 1 tablet by mouth daily.   Yes [provider]  pravastatin (PRAVACHOL) 20 MG tablet Take 20 mg by mouth every evening.    Yes [provider]  ondansetron (ZOFRAN ODT) 4 MG disintegrating tablet Take 1 tablet (4 mg total) by mouth every 8 (eight) hours as needed for nausea. Patient not taking: Reported on 06/07/2021 06/22/18   Sarah Pickett, PA-C  Allergies    Amlodipine, Motrin [ibuprofen], and Tramadol   Review of Systems   Review of Systems A comprehensive review of systems was completed and negative except as noted in HPI.    Physical Exam BP (!) 158/69   Pulse 62   Temp 100.3 F (37.9 C)   Resp (!) 25   Ht '5\' 4"'$  (1.626 m)   Wt 78.9 kg   SpO2 98%   BMI 29.87 kg/m   Physical Exam Vitals and nursing note reviewed.  Constitutional:      Appearance: Normal appearance.  HENT:     Head: Normocephalic and atraumatic.     Nose: Nose normal.     Mouth/Throat:     Mouth: Mucous membranes are moist.  Eyes:     Extraocular Movements: Extraocular movements intact.     Conjunctiva/sclera: Conjunctivae normal.  Cardiovascular:     Rate and Rhythm: Normal rate.  Pulmonary:     Effort: Pulmonary effort is normal.     Breath sounds: Normal breath sounds.  Abdominal:     General: Abdomen is flat.     Palpations: Abdomen is soft.     Tenderness: There is no  abdominal tenderness.  Musculoskeletal:        General: No swelling. Normal range of motion.     Cervical back: Neck supple.  Skin:    General: Skin is warm and dry.  Neurological:     General: No focal deficit present.     Mental Status: She is alert.  Psychiatric:        Mood and Affect: Mood normal.     ED Results / Procedures / Treatments   Labs (all labs ordered are listed, but only abnormal results are displayed) Labs Reviewed  COMPREHENSIVE METABOLIC PANEL - Abnormal; Notable for the following components:      Result Value   Albumin 3.3 (*)    All other components within normal limits  URINALYSIS, ROUTINE W REFLEX MICROSCOPIC - Abnormal; Notable for the following components:   Color, Urine STRAW (*)    Specific Gravity, Urine 1.003 (*)    All other components within normal limits  RESP PANEL BY RT-PCR (FLU A&B, COVID) ARPGX2  CULTURE, BLOOD (ROUTINE X 2)  CULTURE, BLOOD (ROUTINE X 2)  CBC WITH DIFFERENTIAL/PLATELET  LACTIC ACID, PLASMA  LACTIC ACID, PLASMA    EKG EKG Interpretation  Date/Time:  Wednesday June 07 2021 16:51:54 EDT Ventricular Rate:  61 PR Interval:  166 QRS Duration: 87 QT Interval:  411 QTC Calculation: 414 R Axis:   20 Text Interpretation: Sinus rhythm Low voltage, precordial leads Minimal ST depression, inferior leads No significant change since last tracing Confirmed by Calvert Cantor [phone]) on 06/07/2021 5:50:05 PM  Radiology DG Chest Port 1 View  Result Date: 06/07/2021 CLINICAL DATA:  Fever and weakness. EXAM: PORTABLE CHEST 1 VIEW COMPARISON:  Chest radiograph dated 06/21/2018. FINDINGS: No focal consolidation, pleural effusion or pneumothorax. There is chronic interstitial coarsening. The cardiac silhouette is within limits. Atherosclerotic calcification of the aorta. No acute osseous pathology. IMPRESSION: No acute cardiopulmonary process. Electronically Signed   By: Anner Crete M.D.   On: 06/07/2021 19:21     Procedures Procedures  Medications Ordered in the ED Medications - No data to display   MDM Rules/Calculators/A&P MDM Patient's BP is improving without intervention in the ED. Will evaluate for signs of end organ damage and for source of her fever.   ED Course  I have reviewed the triage vital  signs and the nursing notes.  Pertinent labs & imaging results that were available during my care of the patient were reviewed by me and considered in my medical decision making (see chart for details).  Clinical Course as of 06/07/21 2137  Wed Jun 07, 2021  1924 CXR is clear [CS]  1932 CBC is normal.  [CS]  2010 UA is negative. CMP is normal. Lactic acid is neg.  [CS]  2136 Labs and imaging are unremarkable. No signs of significant bacterial infection. No signs of end organ damage and BP is improved here. Recommend close follow up with PCP for long term management of her HTN.  [CS]    Clinical Course User Index [CS] Truddie Hidden, MD    Final Clinical Impression(s) / ED Diagnoses Final diagnoses:  Hypertension, unspecified type  Fever, unknown origin    Rx / DC Orders ED Discharge Orders     None        Truddie Hidden, MD 06/07/21 2138

## 2021-06-07 NOTE — ED Triage Notes (Signed)
Pt BIB EMS for hypertension with a systolic [id number] and a temperature of 99.7, pt took '325mg'$  aspirin and 50 mg of atenolol at 1530 and B continued to stay high. Pt began to feel weak, shaky and hot. Hx of kidnye infections.   NSR 226/210       180/80 CBG 121 96 RA  HR 60

## 2021-06-09 DIAGNOSIS — R509 Fever, unspecified: Secondary | ICD-10-CM | POA: Diagnosis not present

## 2021-06-09 DIAGNOSIS — N179 Acute kidney failure, unspecified: Secondary | ICD-10-CM | POA: Diagnosis not present

## 2021-06-09 DIAGNOSIS — I10 Essential (primary) hypertension: Secondary | ICD-10-CM | POA: Diagnosis not present

## 2021-06-12 LAB — CULTURE, BLOOD (ROUTINE X 2)
Culture: NO GROWTH
Culture: NO GROWTH
Special Requests: ADEQUATE

## 2021-06-15 DIAGNOSIS — I10 Essential (primary) hypertension: Secondary | ICD-10-CM | POA: Diagnosis not present

## 2021-06-16 DIAGNOSIS — I10 Essential (primary) hypertension: Secondary | ICD-10-CM | POA: Diagnosis not present

## 2021-07-06 DIAGNOSIS — I10 Essential (primary) hypertension: Secondary | ICD-10-CM | POA: Diagnosis not present

## 2021-07-06 DIAGNOSIS — I872 Venous insufficiency (chronic) (peripheral): Secondary | ICD-10-CM | POA: Diagnosis not present

## 2021-09-15 DIAGNOSIS — Z23 Encounter for immunization: Secondary | ICD-10-CM | POA: Diagnosis not present

## 2021-09-15 DIAGNOSIS — I10 Essential (primary) hypertension: Secondary | ICD-10-CM | POA: Diagnosis not present

## 2021-09-15 DIAGNOSIS — J439 Emphysema, unspecified: Secondary | ICD-10-CM | POA: Diagnosis not present

## 2021-09-15 DIAGNOSIS — I872 Venous insufficiency (chronic) (peripheral): Secondary | ICD-10-CM | POA: Diagnosis not present

## 2022-01-02 DIAGNOSIS — J441 Chronic obstructive pulmonary disease with (acute) exacerbation: Secondary | ICD-10-CM | POA: Diagnosis not present

## 2022-01-02 DIAGNOSIS — J020 Streptococcal pharyngitis: Secondary | ICD-10-CM | POA: Diagnosis not present

## 2022-01-02 DIAGNOSIS — R0981 Nasal congestion: Secondary | ICD-10-CM | POA: Diagnosis not present

## 2022-01-02 DIAGNOSIS — R350 Frequency of micturition: Secondary | ICD-10-CM | POA: Diagnosis not present

## 2022-02-23 DIAGNOSIS — M810 Age-related osteoporosis without current pathological fracture: Secondary | ICD-10-CM | POA: Diagnosis not present

## 2022-02-23 DIAGNOSIS — R7989 Other specified abnormal findings of blood chemistry: Secondary | ICD-10-CM | POA: Diagnosis not present

## 2022-02-23 DIAGNOSIS — I10 Essential (primary) hypertension: Secondary | ICD-10-CM | POA: Diagnosis not present

## 2022-02-23 DIAGNOSIS — E785 Hyperlipidemia, unspecified: Secondary | ICD-10-CM | POA: Diagnosis not present

## 2023-02-27 IMAGING — DX DG CHEST 1V PORT
1 series · 1 of 1 positions shown · non-contrast
Comparison: Chest radiograph dated 06/21/2018.

CLINICAL DATA: Fever and weakness.

EXAM:
PORTABLE CHEST 1 VIEW

[chest]
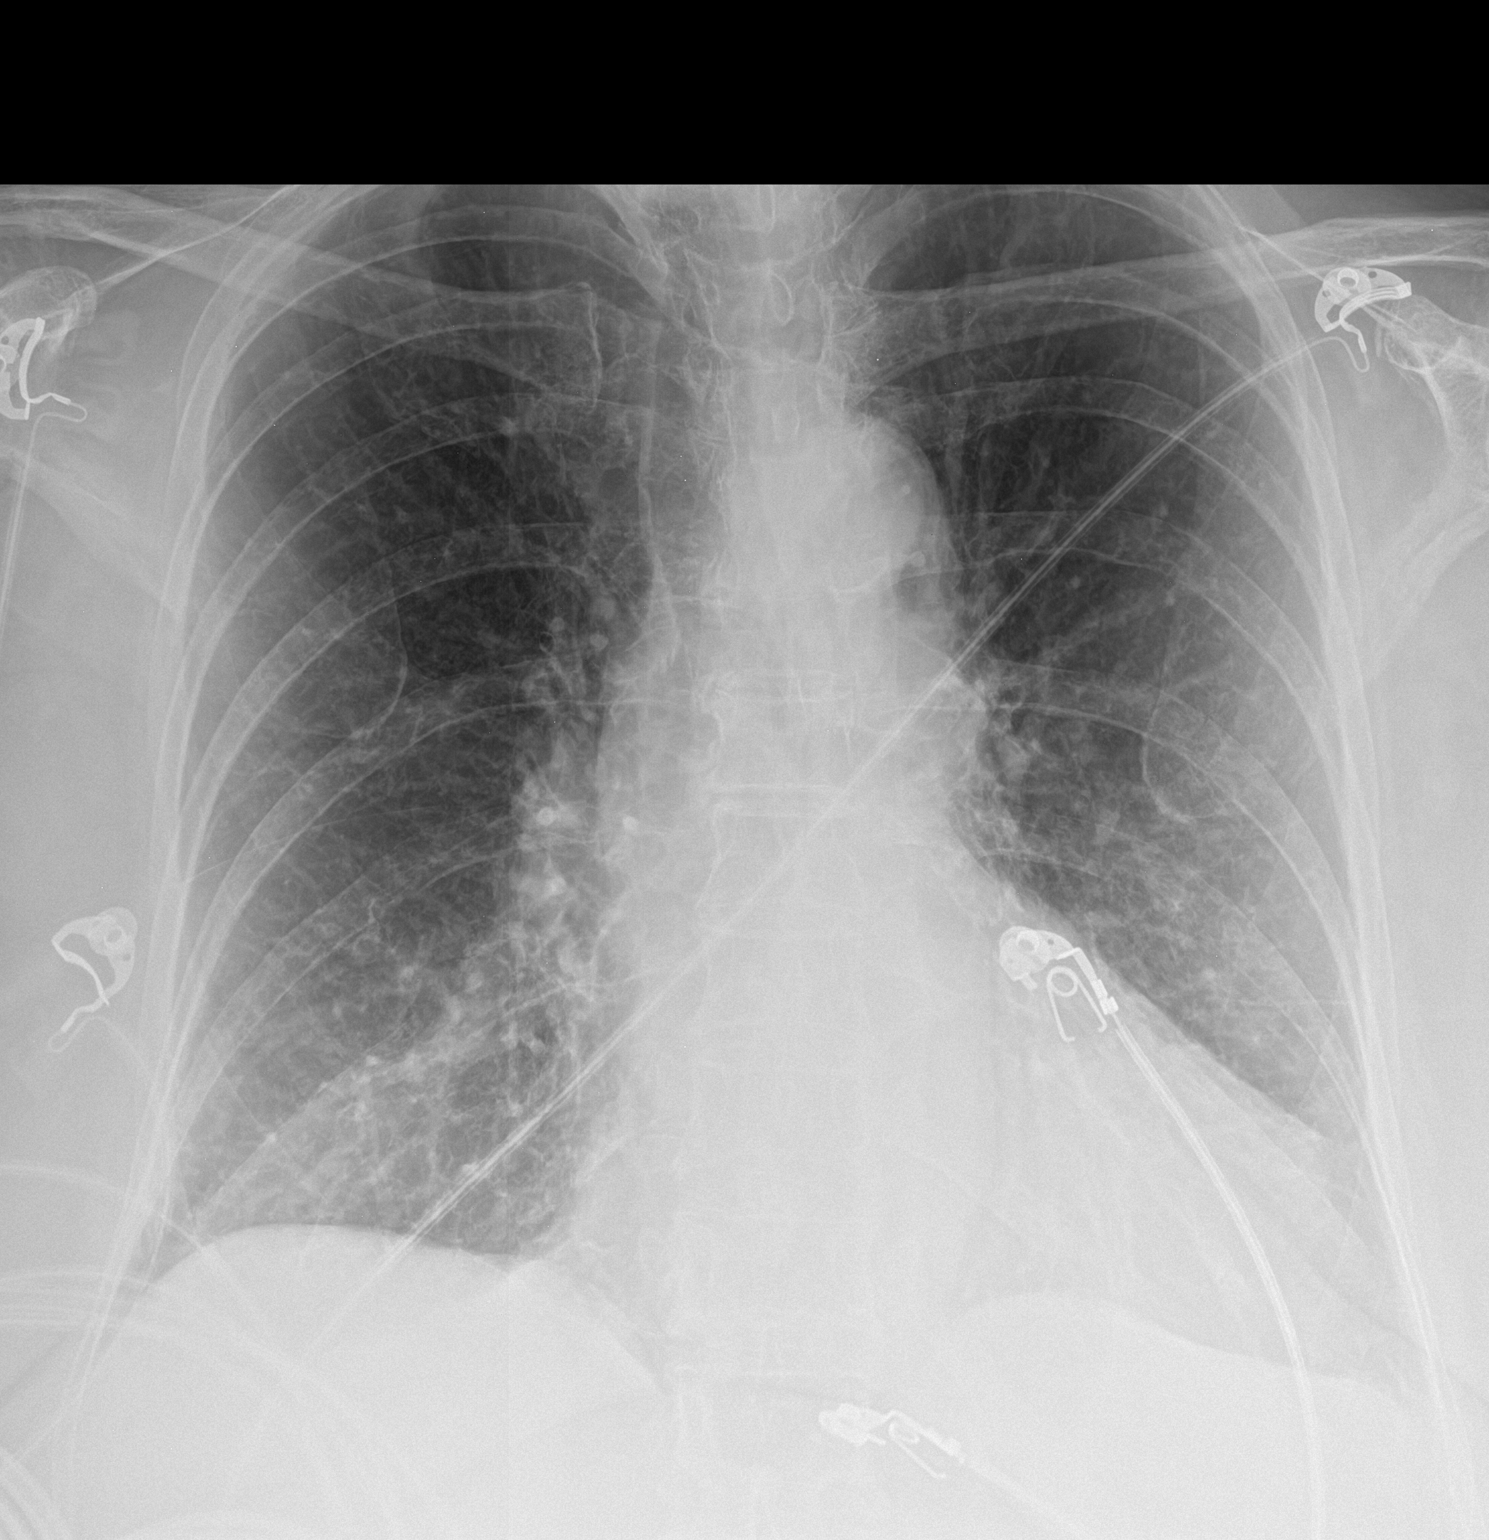

[1 of 1 positions shown; findings below may reference images not displayed]

FINDINGS: No focal consolidation, pleural effusion or pneumothorax. There is
chronic interstitial coarsening. The cardiac silhouette is within
limits. Atherosclerotic calcification of the aorta. No acute osseous
pathology.
IMPRESSION: No acute cardiopulmonary process.

## 2023-07-01 DIAGNOSIS — M810 Age-related osteoporosis without current pathological fracture: Secondary | ICD-10-CM | POA: Diagnosis not present

## 2023-07-01 DIAGNOSIS — E785 Hyperlipidemia, unspecified: Secondary | ICD-10-CM | POA: Diagnosis not present

## 2023-07-01 DIAGNOSIS — I10 Essential (primary) hypertension: Secondary | ICD-10-CM | POA: Diagnosis not present

## 2023-07-08 DIAGNOSIS — Z1331 Encounter for screening for depression: Secondary | ICD-10-CM | POA: Diagnosis not present

## 2023-07-08 DIAGNOSIS — Z1339 Encounter for screening examination for other mental health and behavioral disorders: Secondary | ICD-10-CM | POA: Diagnosis not present

## 2023-07-08 DIAGNOSIS — J439 Emphysema, unspecified: Secondary | ICD-10-CM | POA: Diagnosis not present

## 2023-07-08 DIAGNOSIS — I10 Essential (primary) hypertension: Secondary | ICD-10-CM | POA: Diagnosis not present

## 2023-07-08 DIAGNOSIS — Z23 Encounter for immunization: Secondary | ICD-10-CM | POA: Diagnosis not present

## 2023-07-08 DIAGNOSIS — R82998 Other abnormal findings in urine: Secondary | ICD-10-CM | POA: Diagnosis not present

## 2023-07-08 DIAGNOSIS — Z0000 Encounter for general adult medical examination without abnormal findings: Secondary | ICD-10-CM | POA: Diagnosis not present

## 2023-08-07 DIAGNOSIS — Z1231 Encounter for screening mammogram for malignant neoplasm of breast: Secondary | ICD-10-CM | POA: Diagnosis not present

## 2024-02-03 ENCOUNTER — Other Ambulatory Visit: Payer: Self-pay | Admitting: Internal Medicine

## 2024-02-03 DIAGNOSIS — I10 Essential (primary) hypertension: Secondary | ICD-10-CM | POA: Diagnosis not present

## 2024-02-03 DIAGNOSIS — R635 Abnormal weight gain: Secondary | ICD-10-CM | POA: Diagnosis not present

## 2024-02-03 DIAGNOSIS — R490 Dysphonia: Secondary | ICD-10-CM

## 2024-02-03 DIAGNOSIS — J439 Emphysema, unspecified: Secondary | ICD-10-CM | POA: Diagnosis not present

## 2024-02-05 ENCOUNTER — Ambulatory Visit
Admission: RE | Admit: 2024-02-05 | Discharge: 2024-02-05 | Disposition: A | Discharge status: Home / Self Care | Source: Ambulatory Visit | Attending: Internal Medicine | Admitting: Internal Medicine

## 2024-02-05 DIAGNOSIS — R490 Dysphonia: Secondary | ICD-10-CM

## 2024-02-05 DIAGNOSIS — I6523 Occlusion and stenosis of bilateral carotid arteries: Secondary | ICD-10-CM | POA: Diagnosis not present

## 2024-02-05 DIAGNOSIS — J029 Acute pharyngitis, unspecified: Secondary | ICD-10-CM | POA: Diagnosis not present

## 2024-02-05 MED ORDER — IOPAMIDOL (ISOVUE-300) INJECTION 61%
75.0000 mL | Freq: Once | INTRAVENOUS | Status: AC | PRN
  Administered 2024-02-05: 75 mL via INTRAVENOUS

## 2024-02-12 DIAGNOSIS — R1310 Dysphagia, unspecified: Secondary | ICD-10-CM | POA: Diagnosis not present

## 2024-02-12 DIAGNOSIS — R490 Dysphonia: Secondary | ICD-10-CM | POA: Diagnosis not present

## 2024-02-18 ENCOUNTER — Other Ambulatory Visit (HOSPITAL_COMMUNITY): Payer: Self-pay | Admitting: Physical Medicine and Rehabilitation

## 2024-02-18 DIAGNOSIS — R1310 Dysphagia, unspecified: Secondary | ICD-10-CM

## 2024-02-25 ENCOUNTER — Ambulatory Visit (HOSPITAL_COMMUNITY)
Admission: RE | Admit: 2024-02-25 | Discharge: 2024-02-25 | Disposition: A | Discharge status: Home / Self Care | Source: Ambulatory Visit | Attending: Physical Medicine and Rehabilitation | Admitting: Physical Medicine and Rehabilitation

## 2024-02-25 DIAGNOSIS — Z4682 Encounter for fitting and adjustment of non-vascular catheter: Secondary | ICD-10-CM | POA: Diagnosis not present

## 2024-02-25 DIAGNOSIS — R490 Dysphonia: Secondary | ICD-10-CM | POA: Diagnosis not present

## 2024-02-25 DIAGNOSIS — R1310 Dysphagia, unspecified: Secondary | ICD-10-CM | POA: Insufficient documentation

## 2024-02-25 DIAGNOSIS — K224 Dyskinesia of esophagus: Secondary | ICD-10-CM | POA: Diagnosis not present

## 2024-03-27 ENCOUNTER — Encounter: Payer: Self-pay | Admitting: Gastroenterology

## 2024-03-27 ENCOUNTER — Ambulatory Visit: Admitting: Gastroenterology

## 2024-03-27 VITALS — BP 124/68 | HR 78 | Ht 64.0 in | Wt 163.0 lb

## 2024-03-27 DIAGNOSIS — R490 Dysphonia: Secondary | ICD-10-CM | POA: Diagnosis not present

## 2024-03-27 DIAGNOSIS — R1310 Dysphagia, unspecified: Secondary | ICD-10-CM

## 2024-03-27 DIAGNOSIS — F4321 Adjustment disorder with depressed mood: Secondary | ICD-10-CM | POA: Diagnosis not present

## 2024-03-27 DIAGNOSIS — E039 Hypothyroidism, unspecified: Secondary | ICD-10-CM

## 2024-03-27 NOTE — Progress Notes (Signed)
 Noted

## 2024-03-27 NOTE — Progress Notes (Signed)
 Chief Complaint: Dysphagia Primary GI MD: Dr. Elvin Hammer  HPI: 89 year old female history of COPD, CVA, PE, on Plavix , hypertension, hyperlipidemia presents for evaluation of esophageal dysmotility referred here by otolaryngology (Dr. Aleta Anda)  Patient was seen by otolaryngology May 2025 in which she was having a persistent change in her voice quality over the past 6 weeks characterized by hoarseness worsened from emotional stress of losing her son and grandson earlier this year.  She also reported difficulty swallowing certain foods and medications such as calcium  and vitamin pills and a constant sensation of a lump in her throat on the right side.  CT neck with contrast 01/2024 with no acute abnormality and degenerative changes of C6-C7.  Flexible fiberoptic endoscopy with vocal cords exhibiting normal mobility  Barium swallow tablet showed esophageal dysmotility but no stricture or narrowing in the tablet passed normally Discussed the use of AI scribe software for clinical note transcription with the patient, who gave verbal consent to proceed.  History of Present Illness She has been experiencing persistent changes in her voice and difficulty swallowing, particularly with liquids. She experiences choking when drinking liquids quickly and when eating too fast or with too much food in her mouth. Improvement is noted when she eats and drinks slowly. No heartburn is present, but there is a constant sensation of fullness and tightness on the right side of her throat.  A CT scan of the neck was negative, and a laryngoscopy showed no abnormalities in the vocal cords. A barium swallow test indicated mild esophageal dysmotility, with no narrowing or mass present. She recalls being told she has a slow esophagus. She has not yet started speech therapy but is scheduled to begin soon.  She mentions a family history of throat cancer, as her father died from it. She expresses concern about her thyroid, as she  recently received a prescription for thyroid medication, which she has not yet started. She was informed that her thyroid levels were low.  She has been under significant stress due to personal losses, including the deaths of her son and grandson within the past year. She acknowledges that stress may be exacerbating her symptoms.   PREVIOUS GI WORKUP   Colonoscopy 07/2015 for heme positive stool - Tubular adenoma in ascending colon (2 mm) - Moderate diverticulosis in left colon - Otherwise normal  Past Medical History:  Diagnosis Date   Cerebrovascular disease    COPD (chronic obstructive pulmonary disease) (HCC)    Depression    Emphysema lung (HCC)    Hyperlipidemia    Hypertension    Obesity    Osteoporosis    PE (physical exam), annual    Shingles    Stroke [facility])     Past Surgical History:  Procedure Laterality Date   TONSILLECTOMY      Current Outpatient Medications  Medication Sig Dispense Refill   aspirin  325 MG tablet Take 325 mg by mouth in the morning.     atenolol  (TENORMIN ) 50 MG tablet Take 50 mg by mouth See admin instructions. Take 50 mg by mouth once a day and an additional 50 mg if blood pressure remains elevated     clopidogrel  (PLAVIX ) 75 MG tablet Take 75 mg by mouth in the morning.     furosemide (LASIX) 20 MG tablet Take 20 mg by mouth in the morning.     hydrALAZINE (APRESOLINE) 25 MG tablet Take 25 mg by mouth 3 (three) times daily.     losartan (COZAAR) 100 MG tablet Take  50 mg by mouth at bedtime.     pravastatin (PRAVACHOL) 20 MG tablet Take 20 mg by mouth every evening.      CALCIUM  PO Take 1 tablet by mouth in the morning. (Patient not taking: Reported on 03/27/2024)     Multiple Vitamins-Minerals (CENTRUM SILVER PO) Take 1 tablet by mouth daily. (Patient not taking: Reported on 03/27/2024)     ondansetron  (ZOFRAN  ODT) 4 MG disintegrating tablet Take 1 tablet (4 mg total) by mouth every 8 (eight) hours as needed for nausea. (Patient not taking:  Reported on 03/27/2024) 10 tablet 0   No current facility-administered medications for this visit.    Allergies as of 03/27/2024 - Review Complete 03/27/2024  Allergen Reaction Noted   Amlodipine  Other (See Comments) 06/07/2021   Motrin [ibuprofen] Other (See Comments) 01/27/2015   Tramadol Rash 07/12/2015    Family History  Problem Relation Age of Onset   Lung disease Father    Lung cancer Father    Stroke Mother    Breast cancer Mother    Esophageal cancer Father    Stroke Maternal Uncle        x 2   Heart attack Paternal Uncle 55       x 2    Social History   Socioeconomic History   Marital status: Single    Spouse name: Not on file   Number of children: 1   Years of education: Not on file   Highest education level: Not on file  Occupational History   Occupation: retired   Occupation: retired  Tobacco Use   Smoking status: Former    Current packs/day: 0.00    Types: Cigarettes    Quit date: 12/03/2008    Years since quitting: 15.3   Smokeless tobacco: Never  Vaping Use   Vaping status: Never Used  Substance and Sexual Activity   Alcohol  use: No    Alcohol /week: 0.0 standard drinks of alcohol    Drug use: No   Sexual activity: Not on file  Other Topics Concern   Not on file  Social History Narrative   Not on file   Social Drivers of Health   Financial Resource Strain: Not on file  Food Insecurity: Not on file  Transportation Needs: Not on file  Physical Activity: Not on file  Stress: Not on file  Social Connections: Not on file  Intimate Partner Violence: Not on file    Review of Systems:    Constitutional: No weight loss, fever, chills, weakness or fatigue HEENT: Eyes: No change in vision               Ears, Nose, Throat:  No change in hearing or congestion Skin: No rash or itching Cardiovascular: No chest pain, chest pressure or palpitations   Respiratory: No SOB or cough Gastrointestinal: See HPI and otherwise negative Genitourinary: No  dysuria or change in urinary frequency Neurological: No headache, dizziness or syncope Musculoskeletal: No new muscle or joint pain Hematologic: No bleeding or bruising Psychiatric: No history of depression or anxiety    Physical Exam:  Vital signs: BP 124/68   Pulse 78   Ht 5' 4 (1.626 m)   Wt 163 lb (73.9 kg)   BMI 27.98 kg/m   Constitutional: NAD, alert and cooperative, appears significantly younger than stated age Head:  Normocephalic and atraumatic. Eyes:   PEERL, EOMI. No icterus. Conjunctiva pink. Respiratory: Respirations even and unlabored. Lungs clear to auscultation bilaterally.   No wheezes, crackles, or rhonchi.  Cardiovascular:  Regular rate and rhythm. No peripheral edema, cyanosis or pallor.  Gastrointestinal:  Soft, nondistended, nontender. No rebound or guarding. Normal bowel sounds. No appreciable masses or hepatomegaly. Rectal:  Declines Msk:  Symmetrical without gross deformities. Without edema, no deformity or joint abnormality.  Neurologic:  Alert and  oriented x4;  grossly normal neurologically.  Skin:   Dry and intact without significant lesions or rashes. Psychiatric: Oriented to person, place and time. Demonstrates good judgement and reason without abnormal affect or behaviors.   RELEVANT LABS AND IMAGING: CBC    Component Value Date/Time   WBC 9.1 06/07/2021 1820   RBC 3.99 06/07/2021 1820   HGB 12.1 06/07/2021 1820   HCT 38.2 06/07/2021 1820   PLT 310 06/07/2021 1820   MCV 95.7 06/07/2021 1820   MCH 30.3 06/07/2021 1820   MCHC 31.7 06/07/2021 1820   RDW 13.1 06/07/2021 1820   LYMPHSABS 1.2 06/07/2021 1820   MONOABS 0.7 06/07/2021 1820   EOSABS 0.3 06/07/2021 1820   BASOSABS 0.1 06/07/2021 1820    CMP     Component Value Date/Time   NA 140 06/07/2021 1820   K 3.6 06/07/2021 1820   CL 101 06/07/2021 1820   CO2 25 06/07/2021 1820   GLUCOSE 99 06/07/2021 1820   BUN 9 06/07/2021 1820   CREATININE 0.92 06/07/2021 1820   CALCIUM  9.2  06/07/2021 1820   PROT 6.6 06/07/2021 1820   ALBUMIN 3.3 (L) 06/07/2021 1820   AST 41 06/07/2021 1820   ALT 31 06/07/2021 1820   ALKPHOS 88 06/07/2021 1820   BILITOT 0.6 06/07/2021 1820   GFRNONAA >60 06/07/2021 1820   GFRAA >60 06/21/2018 2218     Assessment/Plan:   Dysphagia Hoarseness Seen by ENT.  Neck CT unremarkable, vocal cords with adequate mobility, and barium swallow tablet showing esophageal dysmotility without evidence of stricture or narrowing.  Dysphagia worse with liquids with drinking likely but can occur when eating solids quickly.  No associated GERD. Her dysphagia is likely from esophageal dysmotility appears to be exacerbated by recent stressor of losing her son and grandson.  She would be high risk for endoscopic evaluation secondary to her age and with no abnormality on barium swallow study yield of endoscopy is low. - Consume liquids in a slow fashion and chew solids adequately - Continue with referral to speech therapy to help with swallowing - If worsening symptoms or evidence of GERD please let us  know - Follow-up as needed  Hypothyroidism (?) Patient notes she was recently called by PCP and given a medicine for her thyroid but is unsure why.  She does not know what the medicine is called. - Recommend calling PCP prior to taking medicine and advised I cannot speak for PCPs recommendations.  Although if thyroid issue is present this could be causing some of her hoarseness and dysmotility as well  Grief reaction Extensive time with patient discussing her anxiety and grief from losing her only son and her grandson a few months apart within the last year.  Advised she find someone to talk to as this can be very difficult and appears to be taking a toll on her physical health  Sarah Graves Sarah Graves [facility] 03/27/2024, 3:01 PM  Cc: Janean Meals, FNP

## 2024-04-18 ENCOUNTER — Ambulatory Visit (HOSPITAL_COMMUNITY)
Admission: EM | Admit: 2024-04-18 | Discharge: 2024-04-18 | Disposition: A | Discharge status: Home / Self Care | Attending: Family Medicine | Admitting: Family Medicine

## 2024-04-18 ENCOUNTER — Encounter (HOSPITAL_COMMUNITY): Payer: Self-pay | Admitting: Emergency Medicine

## 2024-04-18 DIAGNOSIS — R21 Rash and other nonspecific skin eruption: Secondary | ICD-10-CM | POA: Diagnosis not present

## 2024-04-18 MED ORDER — PREDNISONE 10 MG (21) PO TBPK: ORAL_TABLET | Freq: Every day | ORAL | 0 refills | Status: AC

## 2024-04-18 NOTE — ED Provider Notes (Signed)
 [facility]   [id number] 04/18/24 Arrival Time: 1218  ASSESSMENT & PLAN:  1. Rash and nonspecific skin eruption    No signs of skin infection. Trial of: Meds ordered this encounter  Medications   predniSONE  (STERAPRED UNI-PAK 21 TAB) 10 MG (21) TBPK tablet    Sig: Take by mouth daily. Take as directed.    Dispense:  21 tablet    Refill:  0   These look similar to ant bites or chigger bites but she is not convinced.  Will follow up with PCP or here if worsening or failing to improve as anticipated.  Reviewed expectations re: course of current medical issues. Questions answered. Outlined signs and symptoms indicating need for more acute intervention. Patient verbalized understanding. After Visit Summary given.   SUBJECTIVE:  Sarah Graves is a 89 y.o. female who presents with a skin complaint; bumps/rash on ankles initially; now on LE bilaterally and on waist. Extremely itchy and it's driving me crazy. Denies fever. Had been outdoors before noting itching/rash. No tx PTA.   OBJECTIVE: Vitals:   04/18/24 1356  BP: (!) 184/78  Pulse: (!) 57  Resp: 20  Temp: 98.1 F (36.7 C)  TempSrc: Oral  SpO2: 94%    General appearance: alert; no distress Extremities: chronic 2+ edema; moves all extremities normally Skin: warm and dry; scattered erythematous papules and sterile pustules over bilateral ankle with a few areas on upper thighs and on R abdomen Psychological: alert and cooperative; normal mood and affect  Allergies  Allergen Reactions   Amlodipine  Other (See Comments)    Made the already-present swelling in both legs MUCH WORSE   Motrin [Ibuprofen] Other (See Comments)    Skin bumps-not rash   Tramadol Rash    Past Medical History:  Diagnosis Date   Cerebrovascular disease    COPD (chronic obstructive pulmonary disease) (HCC)    Depression    Emphysema lung (HCC)    Hyperlipidemia    Hypertension    Obesity    Osteoporosis    PE (physical  exam), annual    Shingles    Stroke (HCC)    Social History   Socioeconomic History   Marital status: Single    Spouse name: Not on file   Number of children: 1   Years of education: Not on file   Highest education level: Not on file  Occupational History   Occupation: retired   Occupation: retired  Tobacco Use   Smoking status: Former    Current packs/day: 0.00    Types: Cigarettes    Quit date: 12/03/2008    Years since quitting: 15.3   Smokeless tobacco: Never  Vaping Use   Vaping status: Never Used  Substance and Sexual Activity   Alcohol  use: No    Alcohol /week: 0.0 standard drinks of alcohol    Drug use: No   Sexual activity: Not on file  Other Topics Concern   Not on file  Social History Narrative   Not on file   Social Drivers of Health   Financial Resource Strain: Not on file  Food Insecurity: Not on file  Transportation Needs: Not on file  Physical Activity: Not on file  Stress: Not on file  Social Connections: Not on file  Intimate Partner Violence: Not on file   Family History  Problem Relation Age of Onset   Lung disease Father    Lung cancer Father    Stroke Mother    Breast cancer Mother    Esophageal  cancer Father    Stroke Maternal Uncle        x 2   Heart attack Paternal Uncle 55       x 2   Past Surgical History:  Procedure Laterality Date   TONSILLECTOMY        Rolinda Rogue, MD 04/18/24 1626

## 2024-04-18 NOTE — ED Triage Notes (Signed)
 Pt c/o rash on bil lower legs x's 3 days  c/o itching to the areas   St's now she thinks it is spreading to her right side of abd

## 2024-04-19 ENCOUNTER — Ambulatory Visit (HOSPITAL_COMMUNITY)

## 2024-04-22 DIAGNOSIS — R079 Chest pain, unspecified: Secondary | ICD-10-CM | POA: Diagnosis not present

## 2024-04-22 DIAGNOSIS — I10 Essential (primary) hypertension: Secondary | ICD-10-CM | POA: Diagnosis not present

## 2024-04-22 DIAGNOSIS — R001 Bradycardia, unspecified: Secondary | ICD-10-CM | POA: Diagnosis not present

## 2024-04-22 DIAGNOSIS — R509 Fever, unspecified: Secondary | ICD-10-CM | POA: Diagnosis not present

## 2024-04-27 ENCOUNTER — Ambulatory Visit: Attending: Physical Medicine and Rehabilitation | Admitting: Speech Pathology

## 2024-04-27 ENCOUNTER — Other Ambulatory Visit: Payer: Self-pay

## 2024-04-27 DIAGNOSIS — R1312 Dysphagia, oropharyngeal phase: Secondary | ICD-10-CM | POA: Insufficient documentation

## 2024-04-27 DIAGNOSIS — R498 Other voice and resonance disorders: Secondary | ICD-10-CM | POA: Diagnosis not present

## 2024-04-27 NOTE — Therapy (Signed)
 OUTPATIENT SPEECH LANGUAGE PATHOLOGY VOICE EVALUATION   Patient Name: Sarah Graves MRN: [id number] DOB:24-Nov-1932, 89 y.o., female Today's Date: 04/27/2024  PCP: Jakie Toribio MATSU, MD REFERRING PROVIDER: Thedora Planas, FNP  END OF SESSION:  End of Session - 04/27/24 1606     Visit Number 1    Number of Visits 25    Date for SLP Re-Evaluation 07/20/24    SLP Start Time 1445    SLP Stop Time  1535    SLP Time Calculation (min) 50 min    Activity Tolerance Patient tolerated treatment well          Past Medical History:  Diagnosis Date   Cerebrovascular disease    COPD (chronic obstructive pulmonary disease) (HCC)    Depression    Emphysema lung (HCC)    Hyperlipidemia    Hypertension    Obesity    Osteoporosis    PE (physical exam), annual    Shingles    Stroke [facility])    Past Surgical History:  Procedure Laterality Date   TONSILLECTOMY     Patient Active Problem List   Diagnosis Date Noted   TIA (transient ischemic attack) 02/12/2013   Hyperlipidemia 02/12/2013   CHEST PAIN-PRECORDIAL 12/05/2009   Vitamin D deficiency 12/02/2009   Essential hypertension, benign 12/02/2009   PE 12/02/2009   CEREBROVASCULAR DISEASE 12/02/2009   EMPHYSEMA 12/02/2009   Osteoporosis 12/02/2009   DYSPNEA ON EXERTION 12/02/2009   CHEST PAIN, ATYPICAL 12/02/2009   TOBACCO USE, QUIT 12/02/2009    Onset date: 03/10/2024 (referral date)  REFERRING DIAG:  Diagnosis  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 She is uncertain if this change predates the  emotional stress of losing her son and grandson approximately 6 weeks ago. She does not attribute her symptoms to crying or emotional distress. She has been a non-smoker for the past 15 years and does not consume alcohol  or caffeine. She discontinued soda and coffee consumption 3 to 4 years ago due to frequent urination. Her current fluid intake consists of water, milk, and orange juice, with a preference for the latter. She consumes chocolate once or twice a week and does not like peppermint. She recalls undergoing a neck scan, the results of which were normal. She reports no cough.  She also reports difficulty swallowing certain foods and medications, such as calcium  and vitamin pills, often feeling as though she is choking. She has experienced recent episodes of choking on food and mucus  PAIN:  Are you having pain? No  FALLS: Has patient fallen in last 6 months? No, Number of falls:   LIVING ENVIRONMENT: Lives with: lives alone Lives in: House/apartment  PLOF:Level of assistance: Independent with ADLs, Independent with IADLs Employment: Retired  PATIENT GOALS: To sound better  OBJECTIVE:  Note: Objective measures were completed at Evaluation unless otherwise noted.  DIAGNOSTIC FINDINGS: Scope by ENT FNP: Pharynx and larynx: No focal mucosal or submucosal lesions are present. Nasopharynx is clear. The soft palate and tongue base are normal. Oropharynx is unremarkable. Vallecula and epiglottis are within normal limits. Aryepiglottic folds and piriform sinuses  are clear. Vocal cords are midline and symmetric. Trachea is clear.  ESOPHAGRAM IMPRESSION: 1. Esophageal dysmotility. 2. No evidence for an esophageal stricture or narrowing.    COGNITION: Overall cognitive status: Within functional limits for tasks assessed Areas of impairment:   Functional deficits:   SOCIAL HISTORY: Occupation: Retired Counsellor intake: optimal Caffeine/alcohol  intake: minimal Daily voice use:  minimal  PERCEPTUAL VOICE ASSESSMENT: Voice quality: hoarse, rough, and strained Vocal abuse: habitual throat clearing and habitual loudness Resonance: normal Respiratory function: thoracic breathing  OBJECTIVE VOICE ASSESSMENT: Maximum phonation time for sustained ah: 4.81  Conversational loudness average: 77 dB Conversational loudness range: 74-79 dB S/z ratio: unable to elicit sustained s despite max A (Suggestive of dysfunction >1.0)  PATIENT REPORTED OUTCOME MEASURES (PROM): V-RQOL: 23 - rates a 5 or as bad as it can be not knowing what will come out when she starts speaking, a 4 or a lot of difficulty using the phone due to her voice and 2 or a small amount of difficulty running out of air when talking, being frustrated due to her voice, trouble socialized due to voice     SUBJECTIVE DYSPHAGIA REPORTS:  Date of onset: 6 months ago Reported symptoms: choking with large pills, globus sensation, burping, and hoarseness  Current diet: regular and thin liquids  Co-morbid voice changes: Yes  FACTORS WHICH MAY INCREASE RISK OF ADVERSE EVENT IN PRESENCE OF ASPIRATION:  General health: well appearing  Risk factors: GERD or other GI disease    ORAL MOTOR EXAMINATION: Overall status: WFL Comments:   CLINICAL SWALLOW ASSESSMENT:   Dentition: adequate natural dentition Vocal quality at baseline: hoarse, rough, and strained Patient directly observed with POs: Yes: dysphagia 3 (soft) and thin liquids  Feeding: able to feed self Liquids provided by: cup Yale Swallow Protocol: Pass Oral phase signs and symptoms: none Pharyngeal phase signs and symptoms:    no over s/s of pharyngeal dysphagia                                                                                                                TREATMENT DATE:   04/27/24: Evaluation completed - trailed stretch flow phonation with tissue for feedback , resonant exercises with lingual protrusion, hums and forward focus phonation  without improving voice. She required max A to coordinate exhalation and phonation. Straw phonation with hand for feedback of air flow with max A to coordinate exhalation and phonation - she could blow or use strained voice but not coordinate this together. With straw in water, hum bubbles were successful with frequent modeling and instruction to maintain steady phonation for 5-6 seconds. With frequent mod to max A - trained her in HEP of SOVTE in water including hums, glides, accents and song - she was able to achieve steady phonation with bubbles. She sees laryngologist in 2 weeks - will adjust HEP as needed after this consult. Reflux education initiated.   PATIENT EDUCATION: Education details: See Treatment and Patient instructions, HEP for voice, vocal hygiene, reflux precautions Person educated: Patient  Education method: Explanation, Demonstration, Verbal cues, and Handouts Education comprehension: verbal cues required and needs further education  HOME EXERCISE PROGRAM: SOVTE in water, resonant voice, flow phonation, vocal function exercises  GOALS: Goals reviewed with patient? Yes  SHORT TERM GOALS: Target date: 06/17/24 - extended due to scheduling  Pt will complete HEP for voice with occasional min A Baseline: Goal status: INITIAL  2.  Pt will achieve clear phonation 15/20 sentences Baseline:  Goal status: INITIAL  3.  Pt will maintain clear phonation over 5 minute conversation with usual mod A Baseline:  Goal status: INITIAL  4.  Pt will follow 3 vocal hygiene strategies Baseline:  Goal status: INITIAL  5.  Pt will follow swallow precautions and reflux precautions with occasional min A Baseline:  Goal status: INITIAL    LONG TERM GOALS: Target date: 07/20/14  Pt will complete HEP for voice with rare min A Baseline:  Goal status: INITIAL  2.  Pt will maintain clear phonation over 15 minute conversation Baseline:  Goal status: INITIAL  3.  Pt will improve score  on VRQOL Baseline: 23 Goal status: INITIAL  4.  Pt will follow swallow precaution and diet modifications ifi MBSS is indicated Baseline:  Goal status: INITIAL   ASSESSMENT:  CLINICAL IMPRESSION: Patient is a 89 y.o. female who was seen today for significant dysphonia characterized by hoarse, rough, strained voice. She reports difficulty being understood over the phone and in social situations due to her voice. Volume was slightly loud, averaging 76dB in conversation. Minimal throat clearing behavior (3x) during this evaluation. She reports occasional difficulty swallowing and has stopped taking her larger calcium  and MVI due to getting stuck. She states if the food or liquid touches the top of her mouth she will cough or choke but when she eats carefully she does not have any coughing. She passed Yale swallow. Ongoing assessment to determine if MBSS is warranted. I recommend skilled ST to maximize intelligibility, voice quality and safety of swallowing for safety and QOL.   OBJECTIVE IMPAIRMENTS: include voice disorder and dysphagia. These impairments are limiting patient from effectively communicating at home and in community and safety when swallowing. Factors affecting potential to achieve goals and functional outcome are co-morbidities.. Patient will benefit from skilled SLP services to address above impairments and improve overall function.  REHAB POTENTIAL: Good  PLAN:  SLP FREQUENCY: 1-2x/week  SLP DURATION: 12 weeks  PLANNED INTERVENTIONS: Aspiration precaution training, Pharyngeal strengthening exercises, Diet toleration management , Environmental controls, Trials of upgraded texture/liquids, Cueing hierachy, Internal/external aids, Functional tasks, Multimodal communication approach, SLP instruction and feedback, Compensatory strategies, Patient/family education, and [id number] Treatment of speech (30 or 45 min) , MBSS    Nene Aranas, Leita Caldron, CCC-SLP 04/27/2024, 4:19 PM

## 2024-04-27 NOTE — Patient Instructions (Addendum)
  [facility] [phone]  Esophageal dysmotility and Gastroesophageal Reflux  The reflux can cause hoarseness if it enters your throat  Home Exercises  Straw in cup of water with a lid  10 hums with bubbles for 10 seconds  10 Pitch glides up and down with bubbles  20 sirens with bubbles  Amazing grace or the [employer] with bubbles

## 2024-05-05 ENCOUNTER — Telehealth (INDEPENDENT_AMBULATORY_CARE_PROVIDER_SITE_OTHER): Payer: Self-pay | Admitting: Otolaryngology

## 2024-05-05 NOTE — Telephone Encounter (Signed)
 LVM to confirm appt and location [phone] afm

## 2024-05-07 ENCOUNTER — Ambulatory Visit (INDEPENDENT_AMBULATORY_CARE_PROVIDER_SITE_OTHER): Admitting: Otolaryngology

## 2024-05-07 ENCOUNTER — Encounter (INDEPENDENT_AMBULATORY_CARE_PROVIDER_SITE_OTHER): Payer: Self-pay | Admitting: Otolaryngology

## 2024-05-07 VITALS — BP 177/96 | HR 65

## 2024-05-07 DIAGNOSIS — R490 Dysphonia: Secondary | ICD-10-CM | POA: Diagnosis not present

## 2024-05-07 DIAGNOSIS — J383 Other diseases of vocal cords: Secondary | ICD-10-CM

## 2024-05-07 DIAGNOSIS — R1310 Dysphagia, unspecified: Secondary | ICD-10-CM | POA: Diagnosis not present

## 2024-05-07 NOTE — Patient Instructions (Signed)

## 2024-05-07 NOTE — Progress Notes (Signed)
 ENT CONSULT:  Reason for Consult: hoarseness dysphagia to solids    HPI: Discussed the use of AI scribe software for clinical note transcription with the patient, who gave verbal consent to proceed.  History of Present Illness Sarah Graves is an 89 year old female who presents with changes in her voice and difficulty swallowing solids.  She has experienced changes in her voice since around April, 2025, with symptoms persisting for a couple of months prior. She notes difficulty swallowing, particularly with her calcium  pill, and occasional odynophagia. She sometimes experiences choking, although this has improved with smaller bites and slower eating. CT neck was unremarkable in April. Esophagram showed esophageal dysmotility.   She has a history of smoking, having quit over 20 years ago. She has not had any head or neck surgeries, except for a tonsillectomy at age 62.  She experienced significant emotional stress due to the loss of her grandson and son within a 90-day period, which she is still processing.     Records Reviewed:  Office visit with Olam Simpler NP 02/12/24 Sarah Graves is an 89 year old female who presents today as a new patient. She was referred by her primary care doctor, Dr. Toribio Gander, for decreased voice quality.  She reports a persistent change in her voice quality over the past 6 weeks, characterized by hoarseness. She is uncertain if this change predates the emotional stress of losing her son and grandson approximately 6 weeks ago. She does not attribute her symptoms to crying or emotional distress. She has been a non-smoker for the past 15 years and does not consume alcohol  or caffeine. She discontinued soda and coffee consumption 3 to 4 years ago due to frequent urination. Her current fluid intake consists of water, milk, and orange juice, with a preference for the latter. She consumes chocolate once or twice a week and does not like peppermint. She recalls undergoing a  neck scan, the results of which were normal. She reports no cough.  She also reports difficulty swallowing certain foods and medications, such as calcium  and vitamin pills, often feeling as though she is choking. She has experienced recent episodes of choking on food and mucus. She describes a sensation of a lump in her throat, particularly on the right side, which occasionally causes food to lodge, necessitating increased water intake to facilitate swallowing. She reports no difficulty swallowing liquids or regular food.    Past Medical History:  Diagnosis Date   Cerebrovascular disease    COPD (chronic obstructive pulmonary disease) (HCC)    Depression    Emphysema lung (HCC)    Hyperlipidemia    Hypertension    Obesity    Osteoporosis    PE (physical exam), annual    Shingles    Stroke [facility])     Past Surgical History:  Procedure Laterality Date   TONSILLECTOMY      Family History  Problem Relation Age of Onset   Lung disease Father    Lung cancer Father    Stroke Mother    Breast cancer Mother    Esophageal cancer Father    Stroke Maternal Uncle        x 2   Heart attack Paternal Uncle 55       x 2    Social History:  reports that she quit smoking about 15 years ago. Her smoking use included cigarettes. She has never used smokeless tobacco. She reports that she does not drink alcohol  and does not use drugs.  Allergies:  Allergies  Allergen Reactions   Amlodipine  Other (See Comments)    Made the already-present swelling in both legs MUCH WORSE   Motrin [Ibuprofen] Other (See Comments)    Skin bumps-not rash   Tramadol Rash    Medications: I have reviewed the patient's current medications.  The PMH, PSH, Medications, Allergies, and SH were reviewed and updated.  ROS: Constitutional: Negative for fever, weight loss and weight gain. Cardiovascular: Negative for chest pain and dyspnea on exertion. Respiratory: Is not experiencing shortness of breath at  rest. Gastrointestinal: Negative for nausea and vomiting. Neurological: Negative for headaches. Psychiatric: The patient is not nervous/anxious  Blood pressure (!) 177/96, pulse 65, SpO2 92%. There is no height or weight on file to calculate BMI.  PHYSICAL EXAM:  Exam: General: Well-developed, well-nourished Communication and Voice: Clear pitch and clarity Respiratory Respiratory effort: Equal inspiration and expiration without stridor Cardiovascular Peripheral Vascular: Warm extremities with equal color/perfusion Eyes: No nystagmus with equal extraocular motion bilaterally Neuro/Psych/Balance: Patient oriented to person, place, and time; Appropriate mood and affect; Gait is intact with no imbalance; Cranial nerves I-XII are intact Head and Face Inspection: Normocephalic and atraumatic without mass or lesion Palpation: Facial skeleton intact without bony stepoffs Salivary Glands: No mass or tenderness Facial Strength: Facial motility symmetric and full bilaterally ENT Pinna: External ear intact and fully developed External canal: Canal is patent with intact skin Tympanic Membrane: Clear and mobile External Nose: No scar or anatomic deformity Internal Nose: Septum is relatively straight. No polyp, or purulence. Mucosal edema and erythema present.  Bilateral inferior turbinate hypertrophy.  Lips, Teeth, and gums: Mucosa and teeth intact and viable TMJ: No pain to palpation with full mobility Oral cavity/oropharynx: No erythema or exudate, no lesions present Nasopharynx: No mass or lesion with intact mucosa Hypopharynx: Intact mucosa without pooling of secretions Larynx Glottic: Full true vocal cord mobility without lesion or mass VF atrophy Supraglottic: Normal appearing epiglottis and AE folds Interarytenoid Space: Moderate pachydermia&edema Subglottic Space: Patent without lesion or edema Neck Neck and Trachea: Midline trachea without mass or lesion Thyroid : No mass or  nodularity Lymphatics: No lymphadenopathy  Procedure: Preoperative diagnosis: dysphonia and dysphagia   Postoperative diagnosis:   Same + VF atrophy   Procedure: Flexible fiberoptic laryngoscopy  Surgeon: Elena Larry, MD  Anesthesia: Topical lidocaine and Afrin Complications: None Condition is stable throughout exam  Indications and consent:  The patient presents to the clinic with above symptoms. Indirect laryngoscopy view was incomplete. Thus it was recommended that they undergo a flexible fiberoptic laryngoscopy. All of the risks, benefits, and potential complications were reviewed with the patient preoperatively and verbal informed consent was obtained.  Procedure: The patient was seated upright in the clinic. Topical lidocaine and Afrin were applied to the nasal cavity. After adequate anesthesia had occurred, I then proceeded to pass the flexible telescope into the nasal cavity. The nasal cavity was patent without rhinorrhea or polyp. The nasopharynx was also patent without mass or lesion. The base of tongue was visualized and was normal. There were no signs of pooling of secretions in the piriform sinuses. The true vocal folds were mobile bilaterally. There were no signs of glottic or supraglottic mucosal lesion or mass. There was moderate interarytenoid pachydermia and post cricoid edema. The telescope was then slowly withdrawn and the patient tolerated the procedure throughout.   Studies Reviewed: CT neck 02/05/24 1. No acute or focal lesion to explain the patient's symptoms. 2. Atherosclerotic calcifications at the carotid bifurcations bilaterally.  Stenosis is worse right than left. 3. Degenerative changes of the cervical spine are greatest at C6-7 with right greater than left foraminal stenosis  Esophagram 02/25/24 IMPRESSION: 1. Esophageal dysmotility. 2. No evidence for an esophageal stricture or narrowing. Assessment/Plan: Encounter Diagnoses  Name Primary?    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 Treatment options include voice therapy, vocal cord injection augmentation, or medialization thyroplasty surgery. - voice therapy referral sent  - Consider vocal cord injection augmentation in the future  - Provided information about procedural interventions for future consideration.  Dysphagia  Esophageal dysmotility with dysphagia to solids on esophagram. Did not have MBS in the past.  - MBS  RTC if sx persist or if she decides to have injection augmentation procedure.    Thank you for allowing me to participate in the care of this patient. Please do not hesitate to contact me with any questions or concerns.   Elena Larry, MD [facility] Phone: [phone] Fax: [phone]    05/07/2024, 10:16 AM

## 2024-05-14 ENCOUNTER — Other Ambulatory Visit: Payer: Self-pay | Admitting: Internal Medicine

## 2024-05-14 DIAGNOSIS — R0989 Other specified symptoms and signs involving the circulatory and respiratory systems: Secondary | ICD-10-CM

## 2024-05-14 DIAGNOSIS — E039 Hypothyroidism, unspecified: Secondary | ICD-10-CM | POA: Diagnosis not present

## 2024-05-15 ENCOUNTER — Other Ambulatory Visit (HOSPITAL_COMMUNITY): Payer: Self-pay | Admitting: *Deleted

## 2024-05-15 DIAGNOSIS — R1310 Dysphagia, unspecified: Secondary | ICD-10-CM

## 2024-05-28 ENCOUNTER — Encounter (HOSPITAL_COMMUNITY)

## 2024-06-03 ENCOUNTER — Ambulatory Visit
Admission: RE | Admit: 2024-06-03 | Discharge: 2024-06-03 | Disposition: A | Discharge status: Home / Self Care | Source: Ambulatory Visit | Attending: Internal Medicine | Admitting: Internal Medicine

## 2024-06-03 DIAGNOSIS — I6521 Occlusion and stenosis of right carotid artery: Secondary | ICD-10-CM | POA: Diagnosis not present

## 2024-06-03 DIAGNOSIS — R0989 Other specified symptoms and signs involving the circulatory and respiratory systems: Secondary | ICD-10-CM

## 2024-06-05 ENCOUNTER — Ambulatory Visit (HOSPITAL_COMMUNITY)
Admission: RE | Admit: 2024-06-05 | Discharge: 2024-06-05 | Disposition: A | Discharge status: Home / Self Care | Source: Ambulatory Visit | Attending: Otolaryngology | Admitting: Otolaryngology

## 2024-06-05 ENCOUNTER — Ambulatory Visit (HOSPITAL_COMMUNITY)
Admission: RE | Admit: 2024-06-05 | Discharge: 2024-06-05 | Disposition: A | Discharge status: Home / Self Care | Source: Ambulatory Visit | Attending: Internal Medicine | Admitting: Internal Medicine

## 2024-06-05 ENCOUNTER — Other Ambulatory Visit

## 2024-06-05 DIAGNOSIS — R1313 Dysphagia, pharyngeal phase: Secondary | ICD-10-CM | POA: Insufficient documentation

## 2024-06-05 DIAGNOSIS — R1310 Dysphagia, unspecified: Secondary | ICD-10-CM

## 2024-06-05 NOTE — Evaluation (Addendum)
 Modified Barium Swallow Study  Patient Details  Name: Sarah Graves MRN: [id number] Date of Birth: January 13, 1933  Today's Date: 06/05/2024  Modified Barium Swallow completed.  Full report located under Chart Review in the Imaging Section.  History of Present Illness 89 yo female presenting for OP MBS due to reports of dysphagia. She had an esophagram May 2025 that revealed esophageal dysmotility. Scope by ENT FNP in May 2025 was WNL. She had an OP SLP evaluation 04/27/24 that addressed dysphonia. Pt also had c/o trouble swallowing large pills, globus sensation, and eructation. Pt also describes feeling occasionally like liquids will go down her windpipe. PMH includes: COPD, stroke, depression, HLD, HTN   Clinical Impression Pt has a pharyngeal more than oral dysphagia with reduced efficiency but still demonstrating adequate airway protection. She has reduced velopharyngeal closure, hyolaryngeal movement, base of tongue retraction, and pharyngeal squeeze. This results in incomplete epiglottic inversion especially with liquids. There is penetration of thin and nectar thick liquids, but it clears spontaneously either upon completion of that swallow or during subsequent swallows, with no aspiration observed even when consuming larger volumes. Pt also has pharyngeal residue, primarily in the valleculae, for which she performs multiple spontaneous swallows to better clear pharynx. She also has barium retained in the esophagus, which led to retrograde flow through the PES and back into pt's oral cavity x1 with purees. She reports that this is not a normal occurance and attributes it to the taste of the barium. Recommend continuing regular solids and thin liquids with use of swallowing precautions that she is already implementing, like small bites/sips and slow rate. Would also encourage use of esophageal precautions and ongoing f/u with OP SLP.  Factors that may increase risk of adverse event in presence of  aspiration Noe & Lianne 2021): Respiratory or GI disease  Swallow Evaluation Recommendations Recommendations: PO diet PO Diet Recommendation: Regular;Thin liquids (Level 0) Liquid Administration via: Cup;Straw Medication Administration: Whole meds with liquid Supervision: Patient able to self-feed Swallowing strategies  : Slow rate;Small bites/sips;Follow solids with liquids Postural changes: Position pt fully upright for meals;Stay upright 30-60 min after meals Oral care recommendations: Oral care BID (2x/day)      Sarah Graves., M.A. CCC-SLP Acute Rehabilitation Services Office: [phone]  Secure chat preferred  06/05/2024,2:19 PM

## 2024-06-16 ENCOUNTER — Ambulatory Visit: Attending: Physical Medicine and Rehabilitation | Admitting: Speech Pathology

## 2024-06-16 ENCOUNTER — Encounter: Payer: Self-pay | Admitting: Speech Pathology

## 2024-06-16 DIAGNOSIS — R498 Other voice and resonance disorders: Secondary | ICD-10-CM | POA: Insufficient documentation

## 2024-06-16 DIAGNOSIS — R1312 Dysphagia, oropharyngeal phase: Secondary | ICD-10-CM | POA: Diagnosis not present

## 2024-06-16 NOTE — Patient Instructions (Addendum)
    You have tension in your larynx limiting the air flow when you speak, resulting in a hoarse or gravelly voice. We call this pressed speech   We use flow phonation to improve proper air flow during speech. Sounds  that promote air flow include: s, z, sh, th, f, v, ch. We call this flow speech  After you complete the semi-occluded vocal tract exercises (straw exercises & gargling), complete the following twice a day  Use a tissue to focus on airflow:  Whooo 10x Shoe 10x Ha 10x Ha - glide up, stop, then glide down [employer] He- She Fu-Sue Pu-Lu   Who are you?  Who is Beverley?  She sells sea shells  See Sue's shoes  Fifty-Fifty  Hit the hammer  High school hero  Hocus Pocus  To each his own  Zebra's zig zag at the zoo  [employer] chews cheddar cheese  Choosy moms choose [employer] Freddy prefers french fries  Vince vowed to vote  Feel the furry fish  She should polish her shoes  Show them the fresh fruit  Teachers eat ripe peaches at [employer]  Not now nor never  My mama makes me muffins  No one knows Norman's nickname  See Ginnie Sleep soundly by the sea       ACID REFLUX PRECAUTIONS ACID REFLUX can be a possible cause of a voice disorder or throat irritation. Acid reflux is a disorder where acid from your stomach is abnormally spilled over onto your voice box after eating, during sleep, or even during singing. Acid reflux causes irritation and inflammation to your vocal folds and should be avoided and treated by changing eating habits, changing lifestyle, and taking medication (if prescribed by your doctor).   CHANGE EATING HABITS   Avoid "trigger" foods. Certain foods and drinks can trigger acid reflux.  1. Caffeine- in coffee, tea, chocolate, sodas  2. Carbonated beverages  3. Mint and menthol  4. Fatty/fried foods  5. Citrus fruits  6. Tomato products  7. Spicy foods  8. Alcohol     CHANGING LIFESTYLE HABITS   Drink  8 glasses of water per day (64oz)    Stop smoking   Avoid clearing your throat   Allow 3 hours between last big meal and going to bed at night   Keep yourself upright for one hour after you eat   Elevate the head of your bed using 6-inch blocks under the head of the bed or a bed wedge between the box spring and the mattress.   Eat small meals throughout the day rather than 3 big meals   Eat slowly   Wear loose clothing   TAKING MEDICATION   If prescribed one time a day, take 15-30 minutes before breakfast   If prescribed two times a day, take 15-30 minutes before breakfast and 15- 30 minutes before dinner   CHANGING THE WAY YOU USE YOUR VOICE  "Best voice/ Least effort

## 2024-06-16 NOTE — Therapy (Signed)
 OUTPATIENT SPEECH LANGUAGE PATHOLOGY VOICE TREATMENT   Patient Name: Sarah Graves MRN: [id number] DOB:07-Jan-1933, 89 y.o., female Today's Date: 06/16/2024  PCP: Jakie Toribio MATSU, MD REFERRING PROVIDER: Thedora Planas, FNP  END OF SESSION:  End of Session - 06/16/24 1405     Visit Number 2    Number of Visits 25    Date for SLP Re-Evaluation 07/20/24    SLP Start Time 1404    SLP Stop Time  1445    SLP Time Calculation (min) 41 min    Activity Tolerance Patient tolerated treatment well          Past Medical History:  Diagnosis Date   Cerebrovascular disease    COPD (chronic obstructive pulmonary disease) (HCC)    Depression    Emphysema lung (HCC)    Hyperlipidemia    Hypertension    Obesity    Osteoporosis    PE (physical exam), annual    Shingles    Stroke [facility])    Past Surgical History:  Procedure Laterality Date   TONSILLECTOMY     Patient Active Problem List   Diagnosis Date Noted   TIA (transient ischemic attack) 02/12/2013   Hyperlipidemia 02/12/2013   CHEST PAIN-PRECORDIAL 12/05/2009   Vitamin D deficiency 12/02/2009   Essential hypertension, benign 12/02/2009   PE 12/02/2009   CEREBROVASCULAR DISEASE 12/02/2009   EMPHYSEMA 12/02/2009   Osteoporosis 12/02/2009   DYSPNEA ON EXERTION 12/02/2009   CHEST PAIN, ATYPICAL 12/02/2009   TOBACCO USE, QUIT 12/02/2009    Onset date: 03/10/2024 (referral date)  REFERRING DIAG:  Diagnosis  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 She is uncertain if this change predates the emotional stress of losing her son and  grandson approximately 6 weeks ago. She does not attribute her symptoms to crying or emotional distress. She has been a non-smoker for the past 15 years and does not consume alcohol  or caffeine. She discontinued soda and coffee consumption 3 to 4 years ago due to frequent urination. Her current fluid intake consists of water, milk, and orange juice, with a preference for the latter. She consumes chocolate once or twice a week and does not like peppermint. She recalls undergoing a neck scan, the results of which were normal. She reports no cough.  She also reports difficulty swallowing certain foods and medications, such as calcium  and vitamin pills, often feeling as though she is choking. She has experienced recent episodes of choking on food and mucus  PAIN:  Are you having pain? No  FALLS: Has patient fallen in last 6 months? No, Number of falls:   LIVING ENVIRONMENT: Lives with: lives alone Lives in: House/apartment  PLOF:Level of assistance: Independent with ADLs, Independent with IADLs Employment: Retired  PATIENT GOALS: To sound better  OBJECTIVE:  Note: Objective measures were completed at Evaluation unless otherwise noted.  DIAGNOSTIC FINDINGS: Scope by ENT FNP: Pharynx and larynx: No focal mucosal or submucosal lesions are present. Nasopharynx is clear. The soft palate and tongue base are normal. Oropharynx is unremarkable. Vallecula and epiglottis are within normal limits. Aryepiglottic folds and piriform sinuses are clear. Vocal cords are midline and  symmetric. Trachea is clear.  ESOPHAGRAM IMPRESSION: 1. Esophageal dysmotility. 2. No evidence for an esophageal stricture or narrowing.    COGNITION: Overall cognitive status: Within functional limits for tasks assessed Areas of impairment:   Functional deficits:   SOCIAL HISTORY: Occupation: Retired Counsellor intake: optimal Caffeine/alcohol  intake: minimal Daily voice use: minimal  PERCEPTUAL VOICE  ASSESSMENT: Voice quality: hoarse, rough, and strained Vocal abuse: habitual throat clearing and habitual loudness Resonance: normal Respiratory function: thoracic breathing  OBJECTIVE VOICE ASSESSMENT: Maximum phonation time for sustained ah: 4.81  Conversational loudness average: 77 dB Conversational loudness range: 74-79 dB S/z ratio: unable to elicit sustained s despite max A (Suggestive of dysfunction >1.0)  PATIENT REPORTED OUTCOME MEASURES (PROM): V-RQOL: 23 - rates a 5 or as bad as it can be not knowing what will come out when she starts speaking, a 4 or a lot of difficulty using the phone due to her voice and 2 or a small amount of difficulty running out of air when talking, being frustrated due to her voice, trouble socialized due to voice     SUBJECTIVE DYSPHAGIA REPORTS:  Date of onset: 6 months ago Reported symptoms: choking with large pills, globus sensation, burping, and hoarseness  Current diet: regular and thin liquids  Co-morbid voice changes: Yes  FACTORS WHICH MAY INCREASE RISK OF ADVERSE EVENT IN PRESENCE OF ASPIRATION:  General health: well appearing  Risk factors: GERD or other GI disease    ORAL MOTOR EXAMINATION: Overall status: WFL Comments:   CLINICAL SWALLOW ASSESSMENT:   Dentition: adequate natural dentition Vocal quality at baseline: hoarse, rough, and strained Patient directly observed with POs: Yes: dysphagia 3 (soft) and thin liquids  Feeding: able to feed self Liquids provided by: cup [facility] Swallow Protocol: Pass Oral phase signs and symptoms: none Pharyngeal phase signs and symptoms:    no over s/s of pharyngeal dysphagia                                                                                                                TREATMENT DATE:    06/16/24: Pt with questions re: MBSS - reviewed results and recommendations. Taylah reports that she has been paying extra attention when she is eating and drinking, ensuring small well chewed  bites and focusing on swallowing liquids - she has had only 1 episode of getting strangled since MBSS. She enters with harsh, strained voice. SOVTE in water completed with rare min A except glides required ongoing modeling and verbal cues to stop at the top pitch, take a breath before gliding down. Glide down frequently strained - she benefits from verbal cues to use relaxed throat, face and shoulders due to overt tension. Initiated relaxed, gentle high intensity sustained vowel and glide, starting with /h/ to encourage flow phonation - clear phonation achieved with sustained ha 8/10x for 4 second duration and verbal cues to ID and correct strained voice. Glides achieved clear phonation 6/10x, again with ha to reduce laryngeal tension. Flow phonation with tissue for feedback of airflow vs  pressed voice - Jennife required frequent verbal cues to reduce facial, neck and shoulder tension due to MTD. In flow phrases, clear phonation achieved 50% of words - she also required cues to speak in her natural pitch as she was using higher sing song pitch initially. HEP is SOVTE in water, followed by flow phonation syllables with tissue, then high intensity sustained ha and glides with ha, as well as 5 flow phrases which she achieved clear phonation on in therapy.   04/27/24: Evaluation completed - trailed stretch flow phonation with tissue for feedback , resonant exercises with lingual protrusion, hums and forward focus phonation without improving voice. She required max A to coordinate exhalation and phonation. Straw phonation with hand for feedback of air flow with max A to coordinate exhalation and phonation - she could blow or use strained voice but not coordinate this together. With straw in water, hum bubbles were successful with frequent modeling and instruction to maintain steady phonation for 5-6 seconds. With frequent mod to max A - trained her in HEP of SOVTE in water including hums, glides, accents and song - she  was able to achieve steady phonation with bubbles. She sees laryngologist in 2 weeks - will adjust HEP as needed after this consult. Reflux education initiated.   PATIENT EDUCATION: Education details: See Treatment and Patient instructions, HEP for voice, vocal hygiene, reflux precautions Person educated: Patient Education method: Explanation, Demonstration, Verbal cues, and Handouts Education comprehension: verbal cues required and needs further education  HOME EXERCISE PROGRAM: SOVTE in water, resonant voice, flow phonation, vocal function exercises  GOALS: Goals reviewed with patient? Yes  SHORT TERM GOALS: Target date: 06/17/24 - extended due to scheduling  Pt will complete HEP for voice with occasional min A Baseline: Goal status: ONGOING  2.  Pt will achieve clear phonation 15/20 sentences Baseline:  Goal status: ONGOING  3.  Pt will maintain clear phonation over 5 minute conversation with usual mod A Baseline:  Goal status: ONGOING  4.  Pt will follow 3 vocal hygiene strategies Baseline:  Goal status: ONGOING  5.  Pt will follow swallow precautions and reflux precautions with occasional min A Baseline:  Goal status: ONGOING    LONG TERM GOALS: Target date: 07/20/14  Pt will complete HEP for voice with rare min A Baseline:  Goal status: ONGOING  2.  Pt will maintain clear phonation over 15 minute conversation Baseline:  Goal status: ONGOING  3.  Pt will improve score on VRQOL Baseline: 23 Goal status: ONGOING  4.  Pt will follow swallow precaution and diet modifications ifi MBSS is indicated Baseline:  Goal status: ONGOING   ASSESSMENT:  CLINICAL IMPRESSION: Patient is a 89 y.o. female who was seen today for significant dysphonia characterized by hoarse, rough, strained voice. She reports difficulty being understood over the phone and in social situations due to her voice. Volume was slightly loud, averaging 76dB in conversation. Minimal throat  clearing behavior (3x) during this evaluation. She reports occasional difficulty swallowing and has stopped taking her larger calcium  and MVI due to getting stuck. She states if the food or liquid touches the top of her mouth she will cough or choke but when she eats carefully she does not have any coughing. She passed Yale swallow. Ongoing assessment to determine if MBSS is warranted. I recommend skilled ST to maximize intelligibility, voice quality and safety of swallowing for safety and QOL.   OBJECTIVE IMPAIRMENTS: include voice disorder and dysphagia. These impairments are limiting patient  from effectively communicating at home and in community and safety when swallowing. Factors affecting potential to achieve goals and functional outcome are co-morbidities.. Patient will benefit from skilled SLP services to address above impairments and improve overall function.  REHAB POTENTIAL: Good  PLAN:  SLP FREQUENCY: 1-2x/week  SLP DURATION: 12 weeks  PLANNED INTERVENTIONS: Aspiration precaution training, Pharyngeal strengthening exercises, Diet toleration management , Environmental controls, Trials of upgraded texture/liquids, Cueing hierachy, Internal/external aids, Functional tasks, Multimodal communication approach, SLP instruction and feedback, Compensatory strategies, Patient/family education, and [id number] Treatment of speech (30 or 45 min) , MBSS    Kinston Magnan, Leita Caldron, CCC-SLP 06/16/2024, 2:53 PM

## 2024-06-17 ENCOUNTER — Encounter: Admitting: Speech Pathology

## 2024-06-22 ENCOUNTER — Ambulatory Visit: Admitting: Speech Pathology

## 2024-06-22 ENCOUNTER — Encounter: Payer: Self-pay | Admitting: Speech Pathology

## 2024-06-22 DIAGNOSIS — R498 Other voice and resonance disorders: Secondary | ICD-10-CM

## 2024-06-22 DIAGNOSIS — R1312 Dysphagia, oropharyngeal phase: Secondary | ICD-10-CM | POA: Diagnosis not present

## 2024-06-22 NOTE — Therapy (Signed)
 OUTPATIENT SPEECH LANGUAGE PATHOLOGY VOICE TREATMENT   Patient Name: Sarah Graves MRN: [id number] DOB:31-Dec-1932, 89 y.o., female Today's Date: 06/22/2024  PCP: Jakie Toribio MATSU, MD REFERRING PROVIDER: Thedora Planas, FNP  END OF SESSION:  End of Session - 06/22/24 1453     Visit Number 3    Number of Visits 25    Date for SLP Re-Evaluation 07/20/24    SLP Start Time 1447    SLP Stop Time  1530    SLP Time Calculation (min) 43 min    Activity Tolerance Patient tolerated treatment well          Past Medical History:  Diagnosis Date   Cerebrovascular disease    COPD (chronic obstructive pulmonary disease) (HCC)    Depression    Emphysema lung (HCC)    Hyperlipidemia    Hypertension    Obesity    Osteoporosis    PE (physical exam), annual    Shingles    Stroke [facility])    Past Surgical History:  Procedure Laterality Date   TONSILLECTOMY     Patient Active Problem List   Diagnosis Date Noted   TIA (transient ischemic attack) 02/12/2013   Hyperlipidemia 02/12/2013   CHEST PAIN-PRECORDIAL 12/05/2009   Vitamin D deficiency 12/02/2009   Essential hypertension, benign 12/02/2009   PE 12/02/2009   CEREBROVASCULAR DISEASE 12/02/2009   EMPHYSEMA 12/02/2009   Osteoporosis 12/02/2009   DYSPNEA ON EXERTION 12/02/2009   CHEST PAIN, ATYPICAL 12/02/2009   TOBACCO USE, QUIT 12/02/2009    Onset date: 03/10/2024 (referral date)  REFERRING DIAG:  Diagnosis  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 She is uncertain if this change predates the emotional stress of losing her son and grandson approximately 6 weeks ago.  She does not attribute her symptoms to crying or emotional distress. She has been a non-smoker for the past 15 years and does not consume alcohol  or caffeine. She discontinued soda and coffee consumption 3 to 4 years ago due to frequent urination. Her current fluid intake consists of water, milk, and orange juice, with a preference for the latter. She consumes chocolate once or twice a week and does not like peppermint. She recalls undergoing a neck scan, the results of which were normal. She reports no cough.  She also reports difficulty swallowing certain foods and medications, such as calcium  and vitamin pills, often feeling as though she is choking. She has experienced recent episodes of choking on food and mucus  PAIN:  Are you having pain? No  FALLS: Has patient fallen in last 6 months? No, Number of falls:   LIVING ENVIRONMENT: Lives with: lives alone Lives in: House/apartment  PLOF:Level of assistance: Independent with ADLs, Independent with IADLs Employment: Retired  PATIENT GOALS: To sound better  OBJECTIVE:  Note: Objective measures were completed at Evaluation unless otherwise noted.  DIAGNOSTIC FINDINGS: Scope by ENT FNP: Pharynx and larynx: No focal mucosal or submucosal lesions are present. Nasopharynx is clear. The soft palate and tongue base are normal. Oropharynx is unremarkable. Vallecula and epiglottis are within normal limits. Aryepiglottic folds and piriform sinuses are clear. Vocal cords are midline and symmetric. Trachea is clear.  ESOPHAGRAM IMPRESSION: 1. Esophageal dysmotility. 2. No evidence for an esophageal stricture or narrowing.    COGNITION: Overall cognitive status: Within functional limits for tasks assessed Areas of impairment:   Functional deficits:   SOCIAL HISTORY: Occupation: Retired Counsellor intake: optimal Caffeine/alcohol  intake: minimal Daily voice use: minimal  PERCEPTUAL VOICE ASSESSMENT: Voice quality: hoarse, rough, and  strained Vocal abuse: habitual throat clearing and habitual loudness Resonance: normal Respiratory function: thoracic breathing  OBJECTIVE VOICE ASSESSMENT: Maximum phonation time for sustained ah: 4.81  Conversational loudness average: 77 dB Conversational loudness range: 74-79 dB S/z ratio: unable to elicit sustained s despite max A (Suggestive of dysfunction >1.0)  PATIENT REPORTED OUTCOME MEASURES (PROM): V-RQOL: 23 - rates a 5 or as bad as it can be not knowing what will come out when she starts speaking, a 4 or a lot of difficulty using the phone due to her voice and 2 or a small amount of difficulty running out of air when talking, being frustrated due to her voice, trouble socialized due to voice     SUBJECTIVE DYSPHAGIA REPORTS:  Date of onset: 6 months ago Reported symptoms: choking with large pills, globus sensation, burping, and hoarseness  Current diet: regular and thin liquids  Co-morbid voice changes: Yes  FACTORS WHICH MAY INCREASE RISK OF ADVERSE EVENT IN PRESENCE OF ASPIRATION:  General health: well appearing  Risk factors: GERD or other GI disease    ORAL MOTOR EXAMINATION: Overall status: WFL Comments:   CLINICAL SWALLOW ASSESSMENT:   Dentition: adequate natural dentition Vocal quality at baseline: hoarse, rough, and strained Patient directly observed with POs: Yes: dysphagia 3 (soft) and thin liquids  Feeding: able to feed self Liquids provided by: cup [facility] Swallow Protocol: Pass Oral phase signs and symptoms: none Pharyngeal phase signs and symptoms:    no over s/s of pharyngeal dysphagia                                                                                                                TREATMENT DATE:   06/22/24: Cassidee has not completed SOVTE due to having difficulty' with this. Today, SOVTE in water required occasional min A with glide down, and verbal cues to reduce tension in face, neck and shoulders. Strain continues with SOVTE.  Attempted gargle to reduce laryngeal tension - Essense demonstrated gargle 2/4 attempts - instructed her to gargle 2 minutes prior to exercises. Flow syllables using hand to feel air flow for biofeedback completed with 60% clear phonation - attempted resonant knoll with pitch changes and hum with cv syllable cueing forward phonation and feeling sound in her lips and nose, again with 60% clear phonation. Flow phonation phrases with usual mod cues to focus on and slightly exaggerate flow sounds improve vocal quality, however Roselina continues to require max A to carryover into structured tasks generating sentences and in conversation.   06/16/24: Pt with questions re: MBSS - reviewed results and recommendations. Adraine reports that she has been paying extra attention when she is eating and drinking, ensuring  small well chewed bites and focusing on swallowing liquids - she has had only 1 episode of getting strangled since MBSS. She enters with harsh, strained voice. SOVTE in water completed with rare min A except glides required ongoing modeling and verbal cues to stop at the top pitch, take a breath before gliding down. Glide down frequently strained - she benefits from verbal cues to use relaxed throat, face and shoulders due to overt tension. Initiated relaxed, gentle high intensity sustained vowel and glide, starting with /h/ to encourage flow phonation - clear phonation achieved with sustained ha 8/10x for 4 second duration and verbal cues to ID and correct strained voice. Glides achieved clear phonation 6/10x, again with ha to reduce laryngeal tension. Flow phonation with tissue for feedback of airflow vs pressed voice - Chardai required frequent verbal cues to reduce facial, neck and shoulder tension due to MTD. In flow phrases, clear phonation achieved 50% of words - she also required cues to speak in her natural pitch as she was using higher sing song pitch initially. HEP is SOVTE in water, followed by flow phonation  syllables with tissue, then high intensity sustained ha and glides with ha, as well as 5 flow phrases which she achieved clear phonation on in therapy.   04/27/24: Evaluation completed - trailed stretch flow phonation with tissue for feedback , resonant exercises with lingual protrusion, hums and forward focus phonation without improving voice. She required max A to coordinate exhalation and phonation. Straw phonation with hand for feedback of air flow with max A to coordinate exhalation and phonation - she could blow or use strained voice but not coordinate this together. With straw in water, hum bubbles were successful with frequent modeling and instruction to maintain steady phonation for 5-6 seconds. With frequent mod to max A - trained her in HEP of SOVTE in water including hums, glides, accents and song - she was able to achieve steady phonation with bubbles. She sees laryngologist in 2 weeks - will adjust HEP as needed after this consult. Reflux education initiated.   PATIENT EDUCATION: Education details: See Treatment and Patient instructions, HEP for voice, vocal hygiene, reflux precautions Person educated: Patient Education method: Explanation, Demonstration, Verbal cues, and Handouts Education comprehension: verbal cues required and needs further education  HOME EXERCISE PROGRAM: SOVTE in water, resonant voice, flow phonation, vocal function exercises  GOALS: Goals reviewed with patient? Yes  SHORT TERM GOALS: Target date: 06/17/24 - extended due to scheduling  Pt will complete HEP for voice with occasional min A Baseline: Goal status: ONGOING  2.  Pt will achieve clear phonation 15/20 sentences Baseline:  Goal status: ONGOING  3.  Pt will maintain clear phonation over 5 minute conversation with usual mod A Baseline:  Goal status: ONGOING  4.  Pt will follow 3 vocal hygiene strategies Baseline:  Goal status: ONGOING  5.  Pt will follow swallow precautions and reflux  precautions with occasional min A Baseline:  Goal status: ONGOING    LONG TERM GOALS: Target date: 07/20/14  Pt will complete HEP for voice with rare min A Baseline:  Goal status: ONGOING  2.  Pt will maintain clear phonation over 15 minute conversation Baseline:  Goal status: ONGOING  3.  Pt will improve score on VRQOL Baseline: 23 Goal status: ONGOING  4.  Pt will follow swallow precaution and diet modifications ifi MBSS is indicated Baseline:  Goal status: ONGOING   ASSESSMENT:  CLINICAL IMPRESSION: Patient is a 89 y.o. female who  was seen today for significant dysphonia characterized by hoarse, rough, strained voice. She reports difficulty being understood over the phone and in social situations due to her voice. Volume was slightly loud, averaging 76dB in conversation. Minimal throat clearing behavior (3x) during this evaluation. She reports occasional difficulty swallowing and has stopped taking her larger calcium  and MVI due to getting stuck. She states if the food or liquid touches the top of her mouth she will cough or choke but when she eats carefully she does not have any coughing. She passed Yale swallow. Ongoing assessment to determine if MBSS is warranted. I recommend skilled ST to maximize intelligibility, voice quality and safety of swallowing for safety and QOL.   OBJECTIVE IMPAIRMENTS: include voice disorder and dysphagia. These impairments are limiting patient from effectively communicating at home and in community and safety when swallowing. Factors affecting potential to achieve goals and functional outcome are co-morbidities.. Patient will benefit from skilled SLP services to address above impairments and improve overall function.  REHAB POTENTIAL: Good  PLAN:  SLP FREQUENCY: 1-2x/week  SLP DURATION: 12 weeks  PLANNED INTERVENTIONS: Aspiration precaution training, Pharyngeal strengthening exercises, Diet toleration management , Environmental controls,  Trials of upgraded texture/liquids, Cueing hierachy, Internal/external aids, Functional tasks, Multimodal communication approach, SLP instruction and feedback, Compensatory strategies, Patient/family education, and [id number] Treatment of speech (30 or 45 min) , MBSS    Durinda Buzzelli, Leita Caldron, CCC-SLP 06/22/2024, 3:37 PM

## 2024-06-24 ENCOUNTER — Ambulatory Visit: Admitting: Speech Pathology

## 2024-06-24 DIAGNOSIS — R498 Other voice and resonance disorders: Secondary | ICD-10-CM

## 2024-06-24 DIAGNOSIS — R1312 Dysphagia, oropharyngeal phase: Secondary | ICD-10-CM

## 2024-06-24 NOTE — Therapy (Signed)
 OUTPATIENT SPEECH LANGUAGE PATHOLOGY VOICE TREATMENT   Patient Name: JOCLYN ALSOBROOK MRN: [id number] DOB:[birth year], 89 y.o., female Today's Date: 06/24/2024  PCP: Jakie Toribio MATSU, MD REFERRING PROVIDER: Thedora Planas, FNP  END OF SESSION:  End of Session - 06/24/24 1454     Visit Number 4    Number of Visits 25    Date for SLP Re-Evaluation 07/20/24    SLP Start Time 1450    SLP Stop Time  1530    SLP Time Calculation (min) 40 min    Activity Tolerance Patient tolerated treatment well          Past Medical History:  Diagnosis Date   Cerebrovascular disease    COPD (chronic obstructive pulmonary disease) (HCC)    Depression    Emphysema lung (HCC)    Hyperlipidemia    Hypertension    Obesity    Osteoporosis    PE (physical exam), annual    Shingles    Stroke [facility])    Past Surgical History:  Procedure Laterality Date   TONSILLECTOMY     Patient Active Problem List   Diagnosis Date Noted   TIA (transient ischemic attack) 02/12/2013   Hyperlipidemia 02/12/2013   CHEST PAIN-PRECORDIAL 12/05/2009   Vitamin D deficiency 12/02/2009   Essential hypertension, benign 12/02/2009   PE 12/02/2009   CEREBROVASCULAR DISEASE 12/02/2009   EMPHYSEMA 12/02/2009   Osteoporosis 12/02/2009   DYSPNEA ON EXERTION 12/02/2009   CHEST PAIN, ATYPICAL 12/02/2009   TOBACCO USE, QUIT 12/02/2009    Onset date: 03/10/2024 (referral date)  REFERRING DIAG:  Diagnosis  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 She is uncertain if this change predates the emotional stress of losing her son and  grandson approximately 6 weeks ago. She does not attribute her symptoms to crying or emotional distress. She has been a non-smoker for the past 15 years and does not consume alcohol  or caffeine. She discontinued soda and coffee consumption 3 to 4 years ago due to frequent urination. Her current fluid intake consists of water, milk, and orange juice, with a preference for the latter. She consumes chocolate once or twice a week and does not like peppermint. She recalls undergoing a neck scan, the results of which were normal. She reports no cough.  She also reports difficulty swallowing certain foods and medications, such as calcium  and vitamin pills, often feeling as though she is choking. She has experienced recent episodes of choking on food and mucus  PAIN:  Are you having pain? No  FALLS: Has patient fallen in last 6 months? No, Number of falls:   LIVING ENVIRONMENT: Lives with: lives alone Lives in: House/apartment  PLOF:Level of assistance: Independent with ADLs, Independent with IADLs Employment: Retired  PATIENT GOALS: To sound better  OBJECTIVE:  Note: Objective measures were completed at Evaluation unless otherwise noted.  DIAGNOSTIC FINDINGS: Scope by ENT FNP: Pharynx and larynx: No focal mucosal or submucosal lesions are present. Nasopharynx is clear. The soft palate and tongue base are normal. Oropharynx is unremarkable. Vallecula and epiglottis are within normal limits. Aryepiglottic folds and piriform sinuses are clear. Vocal cords are midline and  symmetric. Trachea is clear.  ESOPHAGRAM IMPRESSION: 1. Esophageal dysmotility. 2. No evidence for an esophageal stricture or narrowing.    TREATMENT DATE:   06/24/24: Significant hoarseness persists - Orene requires ongoing training to complete HEP mindfully listening to and correcting her voice and using flow phonation rather than just reading and sustaining vowels without using strategies. Today, yawn-sigh completed with  clear phonation with occasional min A, progressed to just sigh with usual mod A to correct hoarse voice and use of sigh with single and 2 syllable h words. Again Jenin able to achieve clear phonation for a few repetitions, however she reverts back to hoarse voice and pressed speech. Cues to ID when voice is hoarse and correct this - if unable to correct her voice, we returned to yawn-sigh repetitions to re-set clear voice. Yisell frequently requires cues to use the sigh strategy rather than just reading the words. HEP modified to yawn-sigh and use of sigh with 1-2 syllable h words  06/22/24: Elesa has not completed SOVTE due to having difficulty' with this. Today, SOVTE in water required occasional min A with glide down, and verbal cues to reduce tension in face, neck and shoulders. Strain continues with SOVTE. Attempted gargle to reduce laryngeal tension - Kyaira demonstrated gargle 2/4 attempts - instructed her to gargle 2 minutes prior to exercises. Flow syllables using hand to feel air flow for biofeedback completed with 60% clear phonation - attempted resonant knoll with pitch changes and hum with cv syllable cueing forward phonation and feeling sound in her lips and nose, again with 60% clear phonation. Flow phonation phrases with usual mod cues to focus on and slightly exaggerate flow sounds improve vocal quality, however Aella continues to require max A to carryover into structured tasks generating sentences and in conversation.   06/16/24: Pt with questions re: MBSS - reviewed results and recommendations. Marguerette reports that she has been paying extra attention when she is eating and drinking, ensuring small well chewed bites and focusing on swallowing liquids - she has had only 1 episode of getting strangled since MBSS. She enters with harsh, strained voice. SOVTE in water completed with rare min A except glides required ongoing modeling and verbal cues to stop at the top pitch, take a breath before  gliding down. Glide down frequently strained - she benefits from verbal cues to use relaxed throat, face and shoulders due to overt tension. Initiated relaxed, gentle high intensity sustained vowel and glide, starting with /h/ to encourage flow phonation - clear phonation achieved with sustained ha 8/10x for 4 second duration and verbal cues to ID and correct strained voice. Glides achieved clear phonation 6/10x, again with ha to reduce laryngeal tension. Flow phonation with tissue for feedback of airflow vs pressed voice - Macie required frequent verbal cues to reduce facial, neck and shoulder tension due to MTD. In flow phrases, clear phonation achieved 50% of words - she also required cues to speak in her natural pitch as she was using higher sing song pitch initially. HEP is SOVTE in water, followed by flow phonation syllables with tissue, then high intensity sustained ha and glides with ha, as well as 5 flow phrases which she achieved clear phonation on in therapy.   04/27/24: Evaluation completed - trailed stretch flow phonation with tissue for feedback , resonant exercises with lingual protrusion, hums and forward focus phonation without improving voice. She required max A to coordinate exhalation and phonation. Straw phonation with hand for feedback of air flow with  max A to coordinate exhalation and phonation - she could blow or use strained voice but not coordinate this together. With straw in water, hum bubbles were successful with frequent modeling and instruction to maintain steady phonation for 5-6 seconds. With frequent mod to max A - trained her in HEP of SOVTE in water including hums, glides, accents and song - she was able to achieve steady phonation with bubbles. She sees laryngologist in 2 weeks - will adjust HEP as needed after this consult. Reflux education initiated.   PATIENT EDUCATION: Education details: See Treatment and Patient instructions, HEP for voice, vocal hygiene, reflux  precautions Person educated: Patient Education method: Explanation, Demonstration, Verbal cues, and Handouts Education comprehension: verbal cues required and needs further education  HOME EXERCISE PROGRAM: SOVTE in water, resonant voice, flow phonation, vocal function exercises  GOALS: Goals reviewed with patient? Yes  SHORT TERM GOALS: Target date: 06/17/24 - extended due to scheduling  Pt will complete HEP for voice with occasional min A Baseline: Goal status: ONGOING  2.  Pt will achieve clear phonation 15/20 sentences Baseline:  Goal status: ONGOING  3.  Pt will maintain clear phonation over 5 minute conversation with usual mod A Baseline:  Goal status: ONGOING  4.  Pt will follow 3 vocal hygiene strategies Baseline:  Goal status: ONGOING  5.  Pt will follow swallow precautions and reflux precautions with occasional min A Baseline:  Goal status: ONGOING    LONG TERM GOALS: Target date: 07/20/14  Pt will complete HEP for voice with rare min A Baseline:  Goal status: ONGOING  2.  Pt will maintain clear phonation over 15 minute conversation Baseline:  Goal status: ONGOING  3.  Pt will improve score on VRQOL Baseline: 23 Goal status: ONGOING  4.  Pt will follow swallow precaution and diet modifications ifi MBSS is indicated Baseline:  Goal status: ONGOING   ASSESSMENT:  CLINICAL IMPRESSION: Patient is a 89 y.o. female who was seen today for significant dysphonia characterized by hoarse, rough, strained voice. She reports difficulty being understood over the phone and in social situations due to her voice. Volume was slightly loud, averaging 76dB in conversation. Minimal throat clearing behavior (3x) during this evaluation. She reports occasional difficulty swallowing and has stopped taking her larger calcium  and MVI due to getting stuck. She states if the food or liquid touches the top of her mouth she will cough or choke but when she eats carefully she does  not have any coughing. She passed Yale swallow. Ongoing assessment to determine if MBSS is warranted. I recommend skilled ST to maximize intelligibility, voice quality and safety of swallowing for safety and QOL.   OBJECTIVE IMPAIRMENTS: include voice disorder and dysphagia. These impairments are limiting patient from effectively communicating at home and in community and safety when swallowing. Factors affecting potential to achieve goals and functional outcome are co-morbidities.. Patient will benefit from skilled SLP services to address above impairments and improve overall function.  REHAB POTENTIAL: Good  PLAN:  SLP FREQUENCY: 1-2x/week  SLP DURATION: 12 weeks  PLANNED INTERVENTIONS: Aspiration precaution training, Pharyngeal strengthening exercises, Diet toleration management , Environmental controls, Trials of upgraded texture/liquids, Cueing hierachy, Internal/external aids, Functional tasks, Multimodal communication approach, SLP instruction and feedback, Compensatory strategies, Patient/family education, and [id number] Treatment of speech (30 or 45 min) , MBSS    Linnie Delgrande, Leita Caldron, CCC-SLP 06/24/2024, 4:06 PM

## 2024-06-29 ENCOUNTER — Encounter: Payer: Self-pay | Admitting: Speech Pathology

## 2024-06-29 ENCOUNTER — Ambulatory Visit: Admitting: Speech Pathology

## 2024-06-29 DIAGNOSIS — R498 Other voice and resonance disorders: Secondary | ICD-10-CM

## 2024-06-29 DIAGNOSIS — R1312 Dysphagia, oropharyngeal phase: Secondary | ICD-10-CM | POA: Diagnosis not present

## 2024-06-29 NOTE — Patient Instructions (Signed)
   When you are practicing - it is important that you think about how you are using your voice  It is not good to practice in a harsh, strained voice - if all you can do is the yawn sigh to get a clear voice, then that is what you practice  Only practice on words or syllables that you can maintain a clear voice on  If that is just the yawn/sigh and 1 syllable words, then that is all you practice  It is not helpful to just read the words and sentences in a hoarse harsh voice without thinking about controlling your voice muscles to keep a clear voice

## 2024-06-29 NOTE — Therapy (Signed)
 OUTPATIENT SPEECH LANGUAGE PATHOLOGY VOICE TREATMENT   Patient Name: Sarah Graves MRN: [id number] DOB:[birth year], 89 y.o., female Today's Date: 06/29/2024  PCP: Jakie Toribio MATSU, MD REFERRING PROVIDER: Thedora Planas, FNP  END OF SESSION:  End of Session - 06/29/24 1505     Visit Number 5    Number of Visits 25    Date for Recertification  07/20/24    SLP Start Time 1450    SLP Stop Time  1530    SLP Time Calculation (min) 40 min    Activity Tolerance Patient tolerated treatment well          Past Medical History:  Diagnosis Date   Cerebrovascular disease    COPD (chronic obstructive pulmonary disease) (HCC)    Depression    Emphysema lung (HCC)    Hyperlipidemia    Hypertension    Obesity    Osteoporosis    PE (physical exam), annual    Shingles    Stroke [facility])    Past Surgical History:  Procedure Laterality Date   TONSILLECTOMY     Patient Active Problem List   Diagnosis Date Noted   TIA (transient ischemic attack) 02/12/2013   Hyperlipidemia 02/12/2013   CHEST PAIN-PRECORDIAL 12/05/2009   Vitamin D deficiency 12/02/2009   Essential hypertension, benign 12/02/2009   PE 12/02/2009   CEREBROVASCULAR DISEASE 12/02/2009   EMPHYSEMA 12/02/2009   Osteoporosis 12/02/2009   DYSPNEA ON EXERTION 12/02/2009   CHEST PAIN, ATYPICAL 12/02/2009   TOBACCO USE, QUIT 12/02/2009    Onset date: 03/10/2024 (referral date)  REFERRING DIAG:  Diagnosis  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 She is uncertain if this change predates the emotional stress of losing her son and  grandson approximately 6 weeks ago. She does not attribute her symptoms to crying or emotional distress. She has been a non-smoker for the past 15 years and does not consume alcohol  or caffeine. She discontinued soda and coffee consumption 3 to 4 years ago due to frequent urination. Her current fluid intake consists of water, milk, and orange juice, with a preference for the latter. She consumes chocolate once or twice a week and does not like peppermint. She recalls undergoing a neck scan, the results of which were normal. She reports no cough.  She also reports difficulty swallowing certain foods and medications, such as calcium  and vitamin pills, often feeling as though she is choking. She has experienced recent episodes of choking on food and mucus  PAIN:  Are you having pain? No  FALLS: Has patient fallen in last 6 months? No, Number of falls:   LIVING ENVIRONMENT: Lives with: lives alone Lives in: House/apartment  PLOF:Level of assistance: Independent with ADLs, Independent with IADLs Employment: Retired  PATIENT GOALS: To sound better  OBJECTIVE:  Note: Objective measures were completed at Evaluation unless otherwise noted.  DIAGNOSTIC FINDINGS: Scope by ENT FNP: Pharynx and larynx: No focal mucosal or submucosal lesions are present. Nasopharynx is clear. The soft palate and tongue base are normal. Oropharynx is unremarkable. Vallecula and epiglottis are within normal limits. Aryepiglottic folds and piriform sinuses are clear. Vocal cords are midline and  symmetric. Trachea is clear.  ESOPHAGRAM IMPRESSION: 1. Esophageal dysmotility. 2. No evidence for an esophageal stricture or narrowing.    TREATMENT DATE:   06/29/24: [name] enters with hoarse strained voice. Yawn sigh progressing to 1 syllable h words with clear phonation, 2 syllable h words with clear phonation 50% - Temika continues to require frequent mod A to ID when voice becomes harsh and to stop repeating words and  go back to yawn sigh. She requires ongoing education and modeling for being mindful of how she is using her voice, listen to her voice and manipulate her larynx to achieve clear voice. She is reading flow sentences and syllables in HEP with harsh voice and no attempt to correct or change the way she is using her voice. This week, she is to only practice syllables and words that she can maintain clear phonation and when she can't maintain clear phonation, she is to stop and go back to syllables that she can produce clearly.   06/24/24: Significant hoarseness persists - Morgin requires ongoing training to complete HEP mindfully listening to and correcting her voice and using flow phonation rather than just reading and sustaining vowels without using strategies. Today, yawn-sigh completed with clear phonation with occasional min A, progressed to just sigh with usual mod A to correct hoarse voice and use of sigh with single and 2 syllable h words. Again Arihanna able to achieve clear phonation for a few repetitions, however she reverts back to hoarse voice and pressed speech. Cues to ID when voice is hoarse and correct this - if unable to correct her voice, we returned to yawn-sigh repetitions to re-set clear voice. Brett frequently requires cues to use the sigh strategy rather than just reading the words. HEP modified to yawn-sigh and use of sigh with 1-2 syllable h words  06/22/24: Leela has not completed SOVTE due to having difficulty' with this. Today, SOVTE in water required occasional min A with glide down, and verbal cues to reduce tension in face, neck and shoulders. Strain continues with SOVTE. Attempted gargle to reduce laryngeal tension - Kang demonstrated gargle 2/4 attempts - instructed her to gargle 2 minutes prior to exercises. Flow syllables using hand to feel air flow for biofeedback completed with 60% clear phonation - attempted resonant knoll with pitch changes and hum with cv syllable cueing forward  phonation and feeling sound in her lips and nose, again with 60% clear phonation. Flow phonation phrases with usual mod cues to focus on and slightly exaggerate flow sounds improve vocal quality, however Audray continues to require max A to carryover into structured tasks generating sentences and in conversation.   06/16/24: Pt with questions re: MBSS - reviewed results and recommendations. Marlon reports that she has been paying extra attention when she is eating and drinking, ensuring small well chewed bites and focusing on swallowing liquids - she has had only 1 episode of getting strangled since MBSS. She enters with harsh, strained voice. SOVTE in water completed with rare min A except glides required ongoing modeling and verbal cues to stop at the top pitch, take a breath before gliding down. Glide down frequently strained - she benefits from verbal cues to use relaxed throat, face and shoulders due to overt tension. Initiated relaxed, gentle high intensity sustained vowel and glide, starting with /h/ to encourage flow phonation - clear phonation achieved with sustained ha 8/10x for 4 second duration and verbal cues to ID and correct strained voice. Glides achieved clear phonation 6/10x, again  with ha to reduce laryngeal tension. Flow phonation with tissue for feedback of airflow vs pressed voice - Chinmayi required frequent verbal cues to reduce facial, neck and shoulder tension due to MTD. In flow phrases, clear phonation achieved 50% of words - she also required cues to speak in her natural pitch as she was using higher sing song pitch initially. HEP is SOVTE in water, followed by flow phonation syllables with tissue, then high intensity sustained ha and glides with ha, as well as 5 flow phrases which she achieved clear phonation on in therapy.   04/27/24: Evaluation completed - trailed stretch flow phonation with tissue for feedback , resonant exercises with lingual protrusion, hums and forward focus phonation  without improving voice. She required max A to coordinate exhalation and phonation. Straw phonation with hand for feedback of air flow with max A to coordinate exhalation and phonation - she could blow or use strained voice but not coordinate this together. With straw in water, hum bubbles were successful with frequent modeling and instruction to maintain steady phonation for 5-6 seconds. With frequent mod to max A - trained her in HEP of SOVTE in water including hums, glides, accents and song - she was able to achieve steady phonation with bubbles. She sees laryngologist in 2 weeks - will adjust HEP as needed after this consult. Reflux education initiated.   PATIENT EDUCATION: Education details: See Treatment and Patient instructions, HEP for voice, vocal hygiene, reflux precautions Person educated: Patient Education method: Explanation, Demonstration, Verbal cues, and Handouts Education comprehension: verbal cues required and needs further education  HOME EXERCISE PROGRAM: SOVTE in water, resonant voice, flow phonation, vocal function exercises  GOALS: Goals reviewed with patient? Yes  SHORT TERM GOALS: Target date: 06/17/24 - extended due to scheduling  Pt will complete HEP for voice with occasional min A Baseline: Goal status: ONGOING  2.  Pt will achieve clear phonation 15/20 sentences Baseline:  Goal status: ONGOING  3.  Pt will maintain clear phonation over 5 minute conversation with usual mod A Baseline:  Goal status: ONGOING  4.  Pt will follow 3 vocal hygiene strategies Baseline:  Goal status: ONGOING  5.  Pt will follow swallow precautions and reflux precautions with occasional min A Baseline:  Goal status: ONGOING    LONG TERM GOALS: Target date: 07/20/14  Pt will complete HEP for voice with rare min A Baseline:  Goal status: ONGOING  2.  Pt will maintain clear phonation over 15 minute conversation Baseline:  Goal status: ONGOING  3.  Pt will improve score  on VRQOL Baseline: 23 Goal status: ONGOING  4.  Pt will follow swallow precaution and diet modifications ifi MBSS is indicated Baseline:  Goal status: ONGOING   ASSESSMENT:  CLINICAL IMPRESSION: Patient is a 89 y.o. female who was seen today for significant dysphonia characterized by hoarse, rough, strained voice. She reports difficulty being understood over the phone and in social situations due to her voice. Volume was slightly loud, averaging 76dB in conversation. Minimal throat clearing behavior (3x) during this evaluation. She reports occasional difficulty swallowing and has stopped taking her larger calcium  and MVI due to getting stuck. She states if the food or liquid touches the top of her mouth she will cough or choke but when she eats carefully she does not have any coughing. She passed Yale swallow. Ongoing assessment to determine if MBSS is warranted. I recommend skilled ST to maximize intelligibility, voice quality and safety of swallowing for safety and  QOL.   OBJECTIVE IMPAIRMENTS: include voice disorder and dysphagia. These impairments are limiting patient from effectively communicating at home and in community and safety when swallowing. Factors affecting potential to achieve goals and functional outcome are co-morbidities.. Patient will benefit from skilled SLP services to address above impairments and improve overall function.  REHAB POTENTIAL: Good  PLAN:  SLP FREQUENCY: 1-2x/week  SLP DURATION: 12 weeks  PLANNED INTERVENTIONS: Aspiration precaution training, Pharyngeal strengthening exercises, Diet toleration management , Environmental controls, Trials of upgraded texture/liquids, Cueing hierachy, Internal/external aids, Functional tasks, Multimodal communication approach, SLP instruction and feedback, Compensatory strategies, Patient/family education, and [id number] Treatment of speech (30 or 45 min) , MBSS    Andriana Casa, Leita Caldron, CCC-SLP 06/29/2024, 3:39 PM

## 2024-07-01 ENCOUNTER — Encounter: Payer: Self-pay | Admitting: Speech Pathology

## 2024-07-01 ENCOUNTER — Ambulatory Visit: Admitting: Speech Pathology

## 2024-07-01 DIAGNOSIS — R498 Other voice and resonance disorders: Secondary | ICD-10-CM

## 2024-07-01 DIAGNOSIS — R1312 Dysphagia, oropharyngeal phase: Secondary | ICD-10-CM | POA: Diagnosis not present

## 2024-07-01 NOTE — Therapy (Signed)
 OUTPATIENT SPEECH LANGUAGE PATHOLOGY VOICE TREATMENT   Patient Name: Sarah Graves MRN: [id number] DOB:10/24/1932, 89 y.o., female Today's Date: 07/01/2024  PCP: Jakie Toribio MATSU, MD REFERRING PROVIDER: Thedora Planas, FNP  END OF SESSION:  End of Session - 07/01/24 1450     Visit Number 6    Number of Visits 25    Date for Recertification  07/20/24    SLP Start Time 1445    SLP Stop Time  1525    SLP Time Calculation (min) 40 min    Activity Tolerance Patient tolerated treatment well          Past Medical History:  Diagnosis Date   Cerebrovascular disease    COPD (chronic obstructive pulmonary disease) (HCC)    Depression    Emphysema lung (HCC)    Hyperlipidemia    Hypertension    Obesity    Osteoporosis    PE (physical exam), annual    Shingles    Stroke [facility])    Past Surgical History:  Procedure Laterality Date   TONSILLECTOMY     Patient Active Problem List   Diagnosis Date Noted   TIA (transient ischemic attack) 02/12/2013   Hyperlipidemia 02/12/2013   CHEST PAIN-PRECORDIAL 12/05/2009   Vitamin D deficiency 12/02/2009   Essential hypertension, benign 12/02/2009   PE 12/02/2009   CEREBROVASCULAR DISEASE 12/02/2009   EMPHYSEMA 12/02/2009   Osteoporosis 12/02/2009   DYSPNEA ON EXERTION 12/02/2009   CHEST PAIN, ATYPICAL 12/02/2009   TOBACCO USE, QUIT 12/02/2009    Onset date: 03/10/2024 (referral date)  REFERRING DIAG:  Diagnosis  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 She is uncertain if this change predates the emotional stress of losing her son and  grandson approximately 6 weeks ago. She does not attribute her symptoms to crying or emotional distress. She has been a non-smoker for the past 15 years and does not consume alcohol  or caffeine. She discontinued soda and coffee consumption 3 to 4 years ago due to frequent urination. Her current fluid intake consists of water, milk, and orange juice, with a preference for the latter. She consumes chocolate once or twice a week and does not like peppermint. She recalls undergoing a neck scan, the results of which were normal. She reports no cough.  She also reports difficulty swallowing certain foods and medications, such as calcium  and vitamin pills, often feeling as though she is choking. She has experienced recent episodes of choking on food and mucus  PAIN:  Are you having pain? No  FALLS: Has patient fallen in last 6 months? No, Number of falls:   LIVING ENVIRONMENT: Lives with: lives alone Lives in: House/apartment  PLOF:Level of assistance: Independent with ADLs, Independent with IADLs Employment: Retired  PATIENT GOALS: To sound better  OBJECTIVE:  Note: Objective measures were completed at Evaluation unless otherwise noted.  DIAGNOSTIC FINDINGS: Scope by ENT FNP: Pharynx and larynx: No focal mucosal or submucosal lesions are present. Nasopharynx is clear. The soft palate and tongue base are normal. Oropharynx is unremarkable. Vallecula and epiglottis are within normal limits. Aryepiglottic folds and piriform sinuses are clear. Vocal cords are midline and  symmetric. Trachea is clear.  ESOPHAGRAM IMPRESSION: 1. Esophageal dysmotility. 2. No evidence for an esophageal stricture or narrowing.    TREATMENT DATE:   07/01/24: Sarah Graves enters with hoarse voice. She reports she achieved clear phonation on flow syllables yesterday, however was unable to achieve clear voice today at home. Targeted yawn sigh, easy onset and stretch flow to achieve clear voice - progressed to 3 cv flow  syllables with clear phonation maintained. Short flow sentences clear phonation, however longer sentences and 4 cv syllables result in return to harsh strained voice. She requires frequent mod A to ID hoarse strained voice and to stop repeating phrases with hoarse voice, and to go back to yawn sigh or stretch flow to reset clear phonation.   9/22/25[name] Sarah Graves enters with hoarse strained voice. Yawn sigh progressing to 1 syllable h words with clear phonation, 2 syllable h words with clear phonation 50% - Sarah Graves continues to require frequent mod A to ID when voice becomes harsh and to stop repeating words and go back to yawn sigh. She requires ongoing education and modeling for being mindful of how she is using her voice, listen to her voice and manipulate her larynx to achieve clear voice. She is reading flow sentences and syllables in HEP with harsh voice and no attempt to correct or change the way she is using her voice. This week, she is to only practice syllables and words that she can maintain clear phonation and when she can't maintain clear phonation, she is to stop and go back to syllables that she can produce clearly.   06/24/24: Significant hoarseness persists - Sarah Graves requires ongoing training to complete HEP mindfully listening to and correcting her voice and using flow phonation rather than just reading and sustaining vowels without using strategies. Today, yawn-sigh completed with clear phonation with occasional min A, progressed to just sigh with usual mod A to correct hoarse voice and use of sigh with single and 2 syllable h words. Again Sarah Graves able to achieve clear phonation for a few repetitions, however she reverts back to hoarse voice and pressed speech. Cues to ID when voice is hoarse and correct this - if unable to correct her voice, we returned to yawn-sigh repetitions to re-set clear voice. Sarah Graves frequently requires cues to use the sigh strategy rather than just reading the words. HEP modified  to yawn-sigh and use of sigh with 1-2 syllable h words  06/22/24: Sarah Graves has not completed SOVTE due to having difficulty' with this. Today, SOVTE in water required occasional min A with glide down, and verbal cues to reduce tension in face, neck and shoulders. Strain continues with SOVTE. Attempted gargle to reduce laryngeal tension - Sarah Graves demonstrated gargle 2/4 attempts - instructed her to gargle 2 minutes prior to exercises. Flow syllables using hand to feel air flow for biofeedback completed with 60% clear phonation - attempted resonant knoll with pitch changes and hum with cv syllable cueing forward phonation and feeling sound in her lips and nose, again with 60% clear phonation. Flow phonation phrases with usual mod cues to focus on and slightly exaggerate flow sounds improve vocal quality, however Sarah Graves continues to require max A to carryover into structured tasks generating sentences and in conversation.   06/16/24: Pt with questions re: MBSS - reviewed results and recommendations. Sarah Graves reports that she has been paying extra attention when she is eating and drinking, ensuring small well chewed bites and focusing on swallowing liquids - she has had only 1 episode of  getting strangled since MBSS. She enters with harsh, strained voice. SOVTE in water completed with rare min A except glides required ongoing modeling and verbal cues to stop at the top pitch, take a breath before gliding down. Glide down frequently strained - she benefits from verbal cues to use relaxed throat, face and shoulders due to overt tension. Initiated relaxed, gentle high intensity sustained vowel and glide, starting with /h/ to encourage flow phonation - clear phonation achieved with sustained ha 8/10x for 4 second duration and verbal cues to ID and correct strained voice. Glides achieved clear phonation 6/10x, again with ha to reduce laryngeal tension. Flow phonation with tissue for feedback of airflow vs pressed voice - Sarah Graves  required frequent verbal cues to reduce facial, neck and shoulder tension due to MTD. In flow phrases, clear phonation achieved 50% of words - she also required cues to speak in her natural pitch as she was using higher sing song pitch initially. HEP is SOVTE in water, followed by flow phonation syllables with tissue, then high intensity sustained ha and glides with ha, as well as 5 flow phrases which she achieved clear phonation on in therapy.   04/27/24: Evaluation completed - trailed stretch flow phonation with tissue for feedback , resonant exercises with lingual protrusion, hums and forward focus phonation without improving voice. She required max A to coordinate exhalation and phonation. Straw phonation with hand for feedback of air flow with max A to coordinate exhalation and phonation - she could blow or use strained voice but not coordinate this together. With straw in water, hum bubbles were successful with frequent modeling and instruction to maintain steady phonation for 5-6 seconds. With frequent mod to max A - trained her in HEP of SOVTE in water including hums, glides, accents and song - she was able to achieve steady phonation with bubbles. She sees laryngologist in 2 weeks - will adjust HEP as needed after this consult. Reflux education initiated.   PATIENT EDUCATION: Education details: See Treatment and Patient instructions, HEP for voice, vocal hygiene, reflux precautions Person educated: Patient Education method: Explanation, Demonstration, Verbal cues, and Handouts Education comprehension: verbal cues required and needs further education  HOME EXERCISE PROGRAM: SOVTE in water, resonant voice, flow phonation, vocal function exercises  GOALS: Goals reviewed with patient? Yes  SHORT TERM GOALS: Target date: 06/17/24 - extended due to scheduling  Pt will complete HEP for voice with occasional min A Baseline: Goal status: MET  2.  Pt will achieve clear phonation 15/20  sentences Baseline:  Goal status: NOT MET  3.  Pt will maintain clear phonation over 5 minute conversation with usual mod A Baseline:  Goal status: NOT MET  4.  Pt will follow 3 vocal hygiene strategies Baseline:  Goal status: MET  5.  Pt will follow swallow precautions and reflux precautions with occasional min A Baseline:  Goal status: MET    LONG TERM GOALS: Target date: 07/20/14  Pt will complete HEP for voice with rare min A Baseline:  Goal status: ONGOING  2.  Pt will maintain clear phonation over 15 minute conversation Baseline:  Goal status: ONGOING  3.  Pt will improve score on VRQOL Baseline: 23 Goal status: ONGOING  4.  Pt will follow swallow precaution and diet modifications ifi MBSS is indicated Baseline:  Goal status: ONGOING   ASSESSMENT:  CLINICAL IMPRESSION: Patient is a 89 y.o. female who was seen today for significant dysphonia characterized by hoarse, rough, strained voice. She reports difficulty  being understood over the phone and in social situations due to her voice. Volume was slightly loud, averaging 76dB in conversation. Minimal throat clearing behavior (3x) during this evaluation. She reports occasional difficulty swallowing and has stopped taking her larger calcium  and MVI due to getting stuck. She states if the food or liquid touches the top of her mouth she will cough or choke but when she eats carefully she does not have any coughing. She passed [place] swallow. Ongoing assessment to determine if MBSS is warranted. I recommend skilled ST to maximize intelligibility, voice quality and safety of swallowing for safety and QOL.   OBJECTIVE IMPAIRMENTS: include voice disorder and dysphagia. These impairments are limiting patient from effectively communicating at home and in community and safety when swallowing. Factors affecting potential to achieve goals and functional outcome are co-morbidities.. Patient will benefit from skilled SLP services to  address above impairments and improve overall function.  REHAB POTENTIAL: Good  PLAN:  SLP FREQUENCY: 1-2x/week  SLP DURATION: 12 weeks  PLANNED INTERVENTIONS: Aspiration precaution training, Pharyngeal strengthening exercises, Diet toleration management , Environmental controls, Trials of upgraded texture/liquids, Cueing hierachy, Internal/external aids, Functional tasks, Multimodal communication approach, SLP instruction and feedback, Compensatory strategies, Patient/family education, and [id number] Treatment of speech (30 or 45 min) , MBSS    Sarah Graves, Leita Caldron, CCC-SLP 07/01/2024, 3:31 PM

## 2024-07-06 ENCOUNTER — Ambulatory Visit: Admitting: Speech Pathology

## 2024-07-06 ENCOUNTER — Encounter: Payer: Self-pay | Admitting: Speech Pathology

## 2024-07-06 DIAGNOSIS — R498 Other voice and resonance disorders: Secondary | ICD-10-CM

## 2024-07-06 DIAGNOSIS — R1312 Dysphagia, oropharyngeal phase: Secondary | ICD-10-CM | POA: Diagnosis not present

## 2024-07-06 NOTE — Patient Instructions (Signed)
  Sarah Graves, let's stop the voice exercises for now and focus on swallowing exercises   SWALLOWING EXERCISES  Effortful Swallows - Squeeze hard with the muscles in your neck while you swallow your saliva or a sip of water - Repeat 20 times, 2-3 times a day, and use whenever you eat or drink  [place] Swallow - swallow with your tongue sticking out - Stick tongue out and gently bite tongue with your teeth - Swallow, while holding your tongue with your teeth - Repeat 20 times, 2-3 times a day   [employer] -  half swallow  - swallow then hold your voice box up at the top of the swallow for for 5 seconds - Start to swallow, and keep your Adam's apple up by squeezing tight with the muscles of the throat - Hold the squeeze for 5-7 seconds and then relax - Repeat 20 times, 2-3 times a day   Tongue Press - Press your entire tongue as hard as you can against the roof of your mouth for 3-5 seconds - Repeat 20 times, 2-3 times a day        6. CTAR - Chin Tuck Against Resistance              - Place towel, ball or pool noodle under your chin             - Hold for 45 seconds 2-3x  a day             - Pulse up and down 20x 2-3x a day

## 2024-07-06 NOTE — Therapy (Signed)
 OUTPATIENT SPEECH LANGUAGE PATHOLOGY VOICE TREATMENT   Patient Name: Sarah Graves MRN: [id number] DOB:1932/11/20, 89 y.o., female Today's Date: 07/06/2024  PCP: Sarah Toribio MATSU, MD REFERRING PROVIDER: Thedora Planas, FNP  END OF SESSION:  End of Session - 07/06/24 1452     Visit Number 7    Number of Visits 25    Date for Recertification  07/20/24    SLP Start Time 1450    SLP Stop Time  1530    SLP Time Calculation (min) 40 min    Activity Tolerance Patient tolerated treatment well          Past Medical History:  Diagnosis Date   Cerebrovascular disease    COPD (chronic obstructive pulmonary disease) (HCC)    Depression    Emphysema lung (HCC)    Hyperlipidemia    Hypertension    Obesity    Osteoporosis    PE (physical exam), annual    Shingles    Stroke [facility])    Past Surgical History:  Procedure Laterality Date   TONSILLECTOMY     Patient Active Problem List   Diagnosis Date Noted   TIA (transient ischemic attack) 02/12/2013   Hyperlipidemia 02/12/2013   CHEST PAIN-PRECORDIAL 12/05/2009   Vitamin D deficiency 12/02/2009   Essential hypertension, benign 12/02/2009   PE 12/02/2009   CEREBROVASCULAR DISEASE 12/02/2009   EMPHYSEMA 12/02/2009   Osteoporosis 12/02/2009   DYSPNEA ON EXERTION 12/02/2009   CHEST PAIN, ATYPICAL 12/02/2009   TOBACCO USE, QUIT 12/02/2009    Onset date: 03/10/2024 (referral date)  REFERRING DIAG:  Diagnosis  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 She is uncertain if this change predates the emotional stress of losing her son and grandson approximately 6 weeks ago.  She does not attribute her symptoms to crying or emotional distress. She has been a non-smoker for the past 15 years and does not consume alcohol  or caffeine. She discontinued soda and coffee consumption 3 to 4 years ago due to frequent urination. Her current fluid intake consists of water, milk, and orange juice, with a preference for the latter. She consumes chocolate once or twice a week and does not like peppermint. She recalls undergoing a neck scan, the results of which were normal. She reports no cough.  She also reports difficulty swallowing certain foods and medications, such as calcium  and vitamin pills, often feeling as though she is choking. She has experienced recent episodes of choking on food and mucus  PAIN:  Are you having pain? No  FALLS: Has patient fallen in last 6 months? No, Number of falls:   LIVING ENVIRONMENT: Lives with: lives alone Lives in: House/apartment  PLOF:Level of assistance: Independent with ADLs, Independent with IADLs Employment: Retired  PATIENT GOALS: To sound better  OBJECTIVE:  Note: Objective measures were completed at Evaluation unless otherwise noted.  DIAGNOSTIC FINDINGS: Scope by ENT FNP: Pharynx and larynx: No focal mucosal or submucosal lesions are present. Nasopharynx is clear. The soft palate and tongue base are normal. Oropharynx is unremarkable. Vallecula and epiglottis are within normal limits. Aryepiglottic folds and piriform sinuses are clear. Vocal cords are midline and symmetric. Trachea is clear.  ESOPHAGRAM IMPRESSION: 1. Esophageal dysmotility. 2. No evidence for an esophageal stricture or narrowing.    TREATMENT DATE:    07/06/24: I couldn't get the tissue to blow this morning, but I could do it yesterday re: stretch flow. Today with stretch flow, [name] achieved clear voice with rare min A. Yawn sigh progressing to sigh and cv syllables with clear phonation which becomes hoarse with subsequent repetitions. Trials of  resonant voice, vocal function exercises and high intensity voice exercises also have not achieved clear phonation with max A. [name] achieves clear phonation in ST but has been unable to carry this over to home exercises. At this time, we agree to defer voice therapy. [name] has been reporting improved swallowing, however this week she got strangled twice. She completed HEP for dysphagia today with usual mod A - will see her 1-2 more times for training in swallow HEP.  07/01/24: [name] enters with hoarse voice. She reports she achieved clear phonation on flow syllables yesterday, however was unable to achieve clear voice today at home. Targeted yawn sigh, easy onset and stretch flow to achieve clear voice - progressed to 3 cv flow syllables with clear phonation maintained. Short flow sentences clear phonation, however longer sentences and 4 cv syllables result in return to harsh strained voice. She requires frequent mod A to ID hoarse strained voice and to stop repeating phrases with hoarse voice, and to go back to yawn sigh or stretch flow to reset clear phonation.   9/22/25[name] [name] enters with hoarse strained voice. Yawn sigh progressing to 1 syllable h words with clear phonation, 2 syllable h words with clear phonation 50% - [name] continues to require frequent mod A to ID when voice becomes harsh and to stop repeating words and go back to yawn sigh. She requires ongoing education and modeling for being mindful of how she is using her voice, listen to her voice and manipulate her larynx to achieve clear voice. She is reading flow sentences and syllables in HEP with harsh voice and no attempt to correct or change the way she is using her voice. This week, she is to only practice syllables and words that she can maintain clear phonation and when she can't maintain clear phonation, she is to stop and go back to syllables that she can produce clearly.   06/24/24: Significant hoarseness persists - [name] requires  ongoing training to complete HEP mindfully listening to and correcting her voice and using flow phonation rather than just reading and sustaining vowels without using strategies. Today, yawn-sigh completed with clear phonation with occasional min A, progressed to just sigh with usual mod A to correct hoarse voice and use of sigh with single and 2 syllable h words. Again Chyler able to achieve clear phonation for a few repetitions, however she reverts back to hoarse voice and pressed speech. Cues to ID when voice is hoarse and correct this - if unable to correct her voice, we returned to yawn-sigh repetitions to re-set clear voice. Clorine frequently requires cues to use the sigh strategy rather than just reading the words. HEP modified to yawn-sigh and use of sigh with 1-2 syllable h words  06/22/24: Ayari has not completed SOVTE due to having difficulty' with this. Today, SOVTE in water required occasional min A with glide down, and verbal cues to reduce tension in face, neck and shoulders. Strain continues with SOVTE. Attempted gargle to reduce laryngeal tension - Abryana demonstrated gargle 2/4 attempts - instructed her to gargle 2 minutes  prior to exercises. Flow syllables using hand to feel air flow for biofeedback completed with 60% clear phonation - attempted resonant knoll with pitch changes and hum with cv syllable cueing forward phonation and feeling sound in her lips and nose, again with 60% clear phonation. Flow phonation phrases with usual mod cues to focus on and slightly exaggerate flow sounds improve vocal quality, however Ondine continues to require max A to carryover into structured tasks generating sentences and in conversation.   06/16/24: Pt with questions re: MBSS - reviewed results and recommendations. Aiyah reports that she has been paying extra attention when she is eating and drinking, ensuring small well chewed bites and focusing on swallowing liquids - she has had only 1 episode of getting  strangled since MBSS. She enters with harsh, strained voice. SOVTE in water completed with rare min A except glides required ongoing modeling and verbal cues to stop at the top pitch, take a breath before gliding down. Glide down frequently strained - she benefits from verbal cues to use relaxed throat, face and shoulders due to overt tension. Initiated relaxed, gentle high intensity sustained vowel and glide, starting with /h/ to encourage flow phonation - clear phonation achieved with sustained ha 8/10x for 4 second duration and verbal cues to ID and correct strained voice. Glides achieved clear phonation 6/10x, again with ha to reduce laryngeal tension. Flow phonation with tissue for feedback of airflow vs pressed voice - Diandra required frequent verbal cues to reduce facial, neck and shoulder tension due to MTD. In flow phrases, clear phonation achieved 50% of words - she also required cues to speak in her natural pitch as she was using higher sing song pitch initially. HEP is SOVTE in water, followed by flow phonation syllables with tissue, then high intensity sustained ha and glides with ha, as well as 5 flow phrases which she achieved clear phonation on in therapy.   04/27/24: Evaluation completed - trailed stretch flow phonation with tissue for feedback , resonant exercises with lingual protrusion, hums and forward focus phonation without improving voice. She required max A to coordinate exhalation and phonation. Straw phonation with hand for feedback of air flow with max A to coordinate exhalation and phonation - she could blow or use strained voice but not coordinate this together. With straw in water, hum bubbles were successful with frequent modeling and instruction to maintain steady phonation for 5-6 seconds. With frequent mod to max A - trained her in HEP of SOVTE in water including hums, glides, accents and song - she was able to achieve steady phonation with bubbles. She sees laryngologist in 2  weeks - will adjust HEP as needed after this consult. Reflux education initiated.   PATIENT EDUCATION: Education details: See Treatment and Patient instructions, HEP for voice, vocal hygiene, reflux precautions Person educated: Patient Education method: Explanation, Demonstration, Verbal cues, and Handouts Education comprehension: verbal cues required and needs further education  HOME EXERCISE PROGRAM: SOVTE in water, resonant voice, flow phonation, vocal function exercises  GOALS: Goals reviewed with patient? Yes  SHORT TERM GOALS: Target date: 06/17/24 - extended due to scheduling  Pt will complete HEP for voice with occasional min A Baseline: Goal status: MET  2.  Pt will achieve clear phonation 15/20 sentences Baseline:  Goal status: NOT MET  3.  Pt will maintain clear phonation over 5 minute conversation with usual mod A Baseline:  Goal status: NOT MET  4.  Pt will follow 3 vocal hygiene strategies Baseline:  Goal status: MET  5.  Pt will follow swallow precautions and reflux precautions with occasional min A Baseline:  Goal status: MET    LONG TERM GOALS: Target date: 07/20/14  Pt will complete HEP for voice with rare min A Baseline:  Goal status: ONGOING  2.  Pt will maintain clear phonation over 15 minute conversation Baseline:  Goal status: ONGOING  3.  Pt will improve score on VRQOL Baseline: 23 Goal status: ONGOING  4.  Pt will follow swallow precaution and diet modifications ifi MBSS is indicated Baseline:  Goal status: ONGOING  5. Pt will complete HEP for dysphagia with occasional min A              Goal Status: NEW   ASSESSMENT:  CLINICAL IMPRESSION: Patient is a 89 y.o. female who was seen today for significant dysphonia characterized by hoarse, rough, strained voice. With usual mod modeling and cueing, Linsie achieves clear phonation with stretch flow, yawn sigh, sigh and short flow phrases in ST. Resonant voice, vocal function exercises,  high intensity voice exercises did not achieve clear phonation with max A. Millenia has been unable to carryover stretch flow, yawn sigh and flow syllables at home with clear voice, despite using tissue and feeling air flow on her hand as feedback. At this time, we have agreed to defer further voice therapy. Since starting ST, Jerrine has reported improved swallowing and no issues until today. She noted that she got strangled twice since last visit, which has not occurred since she started ST.  She passed Yale swallow. Ongoing assessment to determine if MBSS is warranted.Will continue HEP for dysphagia 1-3 more visits. I recommend skilled ST to maximize intelligibility, voice quality and safety of swallowing for safety and QOL.   OBJECTIVE IMPAIRMENTS: include voice disorder and dysphagia. These impairments are limiting patient from effectively communicating at home and in community and safety when swallowing. Factors affecting potential to achieve goals and functional outcome are co-morbidities.. Patient will benefit from skilled SLP services to address above impairments and improve overall function.  REHAB POTENTIAL: Good  PLAN:  SLP FREQUENCY: 1-2x/week  SLP DURATION: 12 weeks  PLANNED INTERVENTIONS: Aspiration precaution training, Pharyngeal strengthening exercises, Diet toleration management , Environmental controls, Trials of upgraded texture/liquids, Cueing hierachy, Internal/external aids, Functional tasks, Multimodal communication approach, SLP instruction and feedback, Compensatory strategies, Patient/family education, and [id number] Treatment of speech (30 or 45 min) , MBSS    Bobak Oguinn, Leita Caldron, CCC-SLP 07/06/2024, 3:33 PM

## 2024-07-08 ENCOUNTER — Ambulatory Visit: Admitting: Speech Pathology

## 2024-07-13 ENCOUNTER — Encounter: Payer: Self-pay | Admitting: Speech Pathology

## 2024-07-13 ENCOUNTER — Ambulatory Visit: Attending: Physical Medicine and Rehabilitation | Admitting: Speech Pathology

## 2024-07-13 DIAGNOSIS — R498 Other voice and resonance disorders: Secondary | ICD-10-CM | POA: Insufficient documentation

## 2024-07-13 DIAGNOSIS — R1312 Dysphagia, oropharyngeal phase: Secondary | ICD-10-CM | POA: Diagnosis not present

## 2024-07-13 NOTE — Therapy (Signed)
 OUTPATIENT SPEECH LANGUAGE PATHOLOGY VOICE TREATMENT   Patient Name: Sarah Graves MRN: [id number] DOB:[birth year], 89 y.o., female Today's Date: 07/13/2024  PCP: Jakie Toribio MATSU, MD REFERRING PROVIDER: Thedora Planas, FNP  Sarah Graves:  Sarah Graves - 07/13/24 1446     Visit Number 8    Number of Visits 25    Date for Recertification  07/20/24    SLP Start Time 1446    SLP Stop Time  1523    SLP Time Calculation (min) 37 min    Activity Tolerance Patient tolerated treatment well          Past Medical History:  Diagnosis Date   Cerebrovascular disease    COPD (chronic obstructive pulmonary disease) (HCC)    Depression    Emphysema lung (HCC)    Hyperlipidemia    Hypertension    Obesity    Osteoporosis    PE (physical exam), annual    Shingles    Stroke [facility])    Past Surgical History:  Procedure Laterality Date   TONSILLECTOMY     Patient Active Problem List   Diagnosis Date Noted   TIA (transient ischemic attack) 02/12/2013   Hyperlipidemia 02/12/2013   CHEST PAIN-PRECORDIAL 12/05/2009   Vitamin D deficiency 12/02/2009   Essential hypertension, benign 12/02/2009   PE 12/02/2009   CEREBROVASCULAR DISEASE 12/02/2009   EMPHYSEMA 12/02/2009   Osteoporosis 12/02/2009   DYSPNEA ON EXERTION 12/02/2009   CHEST PAIN, ATYPICAL 12/02/2009   TOBACCO USE, QUIT 12/02/2009    Onset date: 03/10/2024 (referral date)  REFERRING DIAG:  Diagnosis  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 She is uncertain if this change predates the emotional  stress of losing her son and grandson approximately 6 weeks ago. She does not attribute her symptoms to crying or emotional distress. She has been a non-smoker for the past 15 years and does not consume alcohol  or caffeine. She discontinued soda and coffee consumption 3 to 4 years ago due to frequent urination. Her current fluid intake consists of water, milk, and orange juice, with a preference for the latter. She consumes chocolate once or twice a week and does not like peppermint. She recalls undergoing a neck scan, the results of which were normal. She reports no cough.  She also reports difficulty swallowing certain foods and medications, such as calcium  and vitamin pills, often feeling as though she is choking. She has experienced recent episodes of choking on food and mucus  PAIN:  Are you having pain? No  FALLS: Has patient fallen in last 6 months? No, Number of falls:   LIVING ENVIRONMENT: Lives with: lives alone Lives in: House/apartment  PLOF:Level of assistance: Independent with ADLs, Independent with IADLs Employment: Retired  PATIENT GOALS: To sound better  OBJECTIVE:  Note: Objective measures were completed at Evaluation unless otherwise noted.  DIAGNOSTIC FINDINGS: Scope by ENT FNP: Pharynx and larynx: No focal mucosal or submucosal lesions are present. Nasopharynx is clear. The soft palate and tongue base are normal. Oropharynx is unremarkable. Vallecula and epiglottis are within normal limits. Aryepiglottic folds and piriform sinuses are clear. Vocal cords  are midline and symmetric. Trachea is clear.  ESOPHAGRAM IMPRESSION: 1. Esophageal dysmotility. 2. No evidence for an esophageal stricture or narrowing.    TREATMENT DATE:   07/23/24: Sarah Graves did get gummy MVI and gummy calcium  supplements for ease of swallowing. Targeted HEP for dysphagia - Sarah Graves demonstrates effortful swallow and Masako accurately 20/20x. [name] required frequent max A to complete 12/20 trials  - initially required consistent max modeling, instruction and using hand on larynx for feedback of laryngeal elevation during swallow. She benefits from cues to do the swallow then verbal cues to hold when elevation achieved. . Tongue press - 20/20 with rare min A - CTAR with 45 second hold 2/2x, CTAR 20 pulses with rare min A after clarification.   07/06/24: I couldn't get the tissue to blow this morning, but I could do it yesterday re: stretch flow. Today with stretch flow, Sarah Graves achieved clear voice with rare min A. Yawn sigh progressing to sigh and cv syllables with clear phonation which becomes hoarse with subsequent repetitions. Trials of resonant voice, vocal function exercises and high intensity voice exercises also have not achieved clear phonation with max A. Sarah Graves achieves clear phonation in ST but has been unable to carry this over to home exercises. At this time, we agree to defer voice therapy. [name] has been reporting improved swallowing, however this week she got strangled twice. She completed HEP for dysphagia today with usual mod A - will see her 1-2 more times for training in swallow HEP.  07/01/24: Sarah Graves enters with hoarse voice. She reports she achieved clear phonation on flow syllables yesterday, however was unable to achieve clear voice today at home. Targeted yawn sigh, easy onset and stretch flow to achieve clear voice - progressed to 3 cv flow syllables with clear phonation maintained. Short flow sentences clear phonation, however longer sentences and 4 cv syllables result in return to harsh strained voice. She requires frequent mod A to ID hoarse strained voice and to stop repeating phrases with hoarse voice, and to go back to yawn sigh or stretch flow to reset clear phonation.   9/22/25[name] Sarah Graves enters with hoarse strained voice. Yawn sigh progressing to 1 syllable h words with clear phonation, 2 syllable h words with clear phonation 50% - Sarah Graves continues to require frequent mod A  to ID when voice becomes harsh and to stop repeating words and go back to yawn sigh. She requires ongoing education and modeling for being mindful of how she is using her voice, listen to her voice and manipulate her larynx to achieve clear voice. She is reading flow sentences and syllables in HEP with harsh voice and no attempt to correct or change the way she is using her voice. This week, she is to only practice syllables and words that she can maintain clear phonation and when she can't maintain clear phonation, she is to stop and go back to syllables that she can produce clearly.   06/24/24: Significant hoarseness persists - Clifton requires ongoing training to complete HEP mindfully listening to and correcting her voice and using flow phonation rather than just reading and sustaining vowels without using strategies. Today, yawn-sigh completed with clear phonation with occasional min A, progressed to just sigh with usual mod A to correct hoarse voice and use of sigh with single and 2 syllable h words. Again Trinisha able to achieve clear phonation for a few repetitions, however she reverts back to hoarse voice and pressed speech. Cues to ID when voice is hoarse  and correct this - if unable to correct her voice, we returned to yawn-sigh repetitions to re-set clear voice. Elesha frequently requires cues to use the sigh strategy rather than just reading the words. HEP modified to yawn-sigh and use of sigh with 1-2 syllable h words  06/22/24: Quiara has not completed SOVTE due to having difficulty' with this. Today, SOVTE in water required occasional min A with glide down, and verbal cues to reduce tension in face, neck and shoulders. Strain continues with SOVTE. Attempted gargle to reduce laryngeal tension - Naveena demonstrated gargle 2/4 attempts - instructed her to gargle 2 minutes prior to exercises. Flow syllables using hand to feel air flow for biofeedback completed with 60% clear phonation - attempted resonant  knoll with pitch changes and hum with cv syllable cueing forward phonation and feeling sound in her lips and nose, again with 60% clear phonation. Flow phonation phrases with usual mod cues to focus on and slightly exaggerate flow sounds improve vocal quality, however Peniel continues to require max A to carryover into structured tasks generating sentences and in conversation.   06/16/24: Pt with questions re: MBSS - reviewed results and recommendations. Gailyn reports that she has been paying extra attention when she is eating and drinking, ensuring small well chewed bites and focusing on swallowing liquids - she has had only 1 episode of getting strangled since MBSS. She enters with harsh, strained voice. SOVTE in water completed with rare min A except glides required ongoing modeling and verbal cues to stop at the top pitch, take a breath before gliding down. Glide down frequently strained - she benefits from verbal cues to use relaxed throat, face and shoulders due to overt tension. Initiated relaxed, gentle high intensity sustained vowel and glide, starting with /h/ to encourage flow phonation - clear phonation achieved with sustained ha 8/10x for 4 second duration and verbal cues to ID and correct strained voice. Glides achieved clear phonation 6/10x, again with ha to reduce laryngeal tension. Flow phonation with tissue for feedback of airflow vs pressed voice - Marycruz required frequent verbal cues to reduce facial, neck and shoulder tension due to MTD. In flow phrases, clear phonation achieved 50% of words - she also required cues to speak in her natural pitch as she was using higher sing song pitch initially. HEP is SOVTE in water, followed by flow phonation syllables with tissue, then high intensity sustained ha and glides with ha, as well as 5 flow phrases which she achieved clear phonation on in therapy.   04/27/24: Evaluation completed - trailed stretch flow phonation with tissue for feedback , resonant  exercises with lingual protrusion, hums and forward focus phonation without improving voice. She required max A to coordinate exhalation and phonation. Straw phonation with hand for feedback of air flow with max A to coordinate exhalation and phonation - she could blow or use strained voice but not coordinate this together. With straw in water, hum bubbles were successful with frequent modeling and instruction to maintain steady phonation for 5-6 seconds. With frequent mod to max A - trained her in HEP of SOVTE in water including hums, glides, accents and song - she was able to achieve steady phonation with bubbles. She sees laryngologist in 2 weeks - will adjust HEP as needed after this consult. Reflux education initiated.   PATIENT EDUCATION: Education details: See Treatment and Patient instructions, HEP for voice, vocal hygiene, reflux precautions Person educated: Patient Education method: Explanation, Demonstration, Verbal cues, and Handouts Education comprehension:  verbal cues required and needs further education  HOME EXERCISE PROGRAM: SOVTE in water, resonant voice, flow phonation, vocal function exercises  GOALS: Goals reviewed with patient? Yes  SHORT TERM GOALS: Target date: 06/17/24 - extended due to scheduling  Pt will complete HEP for voice with occasional min A Baseline: Goal status: MET  2.  Pt will achieve clear phonation 15/20 sentences Baseline:  Goal status: NOT MET  3.  Pt will maintain clear phonation over 5 minute conversation with usual mod A Baseline:  Goal status: NOT MET  4.  Pt will follow 3 vocal hygiene strategies Baseline:  Goal status: MET  5.  Pt will follow swallow precautions and reflux precautions with occasional min A Baseline:  Goal status: MET    LONG TERM GOALS: Target date: 07/20/14  Pt will complete HEP for voice with rare min A Baseline:  Goal status: ONGOING  2.  Pt will maintain clear phonation over 15 minute  conversation Baseline:  Goal status: ONGOING  3.  Pt will improve score on VRQOL Baseline: 23 Goal status: ONGOING  4.  Pt will follow swallow precaution and diet modifications ifi MBSS is indicated Baseline:  Goal status: ONGOING  5. Pt will complete HEP for dysphagia with occasional min A              Goal Status: NEW   ASSESSMENT:  CLINICAL IMPRESSION: Patient is a 89 y.o. female who was seen today for significant dysphonia characterized by hoarse, rough, strained voice. With usual mod modeling and cueing, Kinzley achieves clear phonation with stretch flow, yawn sigh, sigh and short flow phrases in ST. Resonant voice, vocal function exercises, high intensity voice exercises did not achieve clear phonation with max A. Derriona has been unable to carryover stretch flow, yawn sigh and flow syllables at home with clear voice, despite using tissue and feeling air flow on her hand as feedback. At this time, we have agreed to defer further voice therapy. Since starting ST, Linzie has reported improved swallowing and no issues until today. She noted that she got strangled twice since last visit, which has not occurred since she started ST.  She passed Yale swallow. Ongoing assessment to determine if MBSS is warranted.Will continue HEP for dysphagia 1-3 more visits. I recommend skilled ST to maximize intelligibility, voice quality and safety of swallowing for safety and QOL.   OBJECTIVE IMPAIRMENTS: include voice disorder and dysphagia. These impairments are limiting patient from effectively communicating at home and in community and safety when swallowing. Factors affecting potential to achieve goals and functional outcome are co-morbidities.. Patient will benefit from skilled SLP services to address above impairments and improve overall function.  REHAB POTENTIAL: Good  PLAN:  SLP FREQUENCY: 1-2x/week  SLP DURATION: 12 weeks  PLANNED INTERVENTIONS: Aspiration precaution training, Pharyngeal  strengthening exercises, Diet toleration management , Environmental controls, Trials of upgraded texture/liquids, Cueing hierachy, Internal/external aids, Functional tasks, Multimodal communication approach, SLP instruction and feedback, Compensatory strategies, Patient/family education, and [id number] Treatment of speech (30 or 45 min) , MBSS    Ommie Degeorge, Leita Caldron, CCC-SLP 07/13/2024, 3:23 PM

## 2024-07-15 ENCOUNTER — Ambulatory Visit: Admitting: Speech Pathology

## 2024-07-15 ENCOUNTER — Encounter: Payer: Self-pay | Admitting: Speech Pathology

## 2024-07-15 DIAGNOSIS — R498 Other voice and resonance disorders: Secondary | ICD-10-CM | POA: Diagnosis not present

## 2024-07-15 DIAGNOSIS — R1312 Dysphagia, oropharyngeal phase: Secondary | ICD-10-CM

## 2024-07-15 NOTE — Therapy (Signed)
 OUTPATIENT SPEECH LANGUAGE PATHOLOGY VOICE TREATMENT & DISCHARGE SUMMARY   Patient Name: Sarah Graves MRN: [id number] DOB:Dec 06, 1932, 89 y.o., female Today's Date: 07/15/2024  PCP: Jakie Toribio MATSU, MD REFERRING PROVIDER: Thedora Planas, FNP  END OF SESSION:  End of Session - 07/15/24 1412     Visit Number 9    Number of Visits 25    Date for Recertification  07/20/24    SLP Start Time 1404    SLP Stop Time  1433    SLP Time Calculation (min) 29 min    Activity Tolerance Patient tolerated treatment well          Past Medical History:  Diagnosis Date   Cerebrovascular disease    COPD (chronic obstructive pulmonary disease) (HCC)    Depression    Emphysema lung (HCC)    Hyperlipidemia    Hypertension    Obesity    Osteoporosis    PE (physical exam), annual    Shingles    Stroke [facility])    Past Surgical History:  Procedure Laterality Date   TONSILLECTOMY     Patient Active Problem List   Diagnosis Date Noted   TIA (transient ischemic attack) 02/12/2013   Hyperlipidemia 02/12/2013   CHEST PAIN-PRECORDIAL 12/05/2009   Vitamin D deficiency 12/02/2009   Essential hypertension, benign 12/02/2009   PE 12/02/2009   CEREBROVASCULAR DISEASE 12/02/2009   EMPHYSEMA 12/02/2009   Osteoporosis 12/02/2009   DYSPNEA ON EXERTION 12/02/2009   CHEST PAIN, ATYPICAL 12/02/2009   TOBACCO USE, QUIT 12/02/2009    Onset date: 03/10/2024 (referral date)  REFERRING DIAG:  Diagnosis  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 She is uncertain if this change predates  the emotional stress of losing her son and grandson approximately 6 weeks ago. She does not attribute her symptoms to crying or emotional distress. She has been a non-smoker for the past 15 years and does not consume alcohol  or caffeine. She discontinued soda and coffee consumption 3 to 4 years ago due to frequent urination. Her current fluid intake consists of water, milk, and orange juice, with a preference for the latter. She consumes chocolate once or twice a week and does not like peppermint. She recalls undergoing a neck scan, the results of which were normal. She reports no cough.  She also reports difficulty swallowing certain foods and medications, such as calcium  and vitamin pills, often feeling as though she is choking. She has experienced recent episodes of choking on food and mucus  PAIN:  Are you having pain? No  FALLS: Has patient fallen in last 6 months? No, Number of falls:   LIVING ENVIRONMENT: Lives with: lives alone Lives in: House/apartment  PLOF:Level of assistance: Independent with ADLs, Independent with IADLs Employment: Retired  PATIENT GOALS: To sound better  OBJECTIVE:  Note: Objective measures were completed at Evaluation unless otherwise noted.  DIAGNOSTIC FINDINGS: Scope by ENT FNP: Pharynx and larynx: No focal mucosal or submucosal lesions are present. Nasopharynx is clear. The soft palate and tongue base are normal. Oropharynx is unremarkable. Vallecula and epiglottis are within normal limits. Aryepiglottic folds and piriform sinuses are  clear. Vocal cords are midline and symmetric. Trachea is clear.  ESOPHAGRAM IMPRESSION: 1. Esophageal dysmotility. 2. No evidence for an esophageal stricture or narrowing.    TREATMENT DATE:   07/15/24: HEP for dysphagia completed with 15 reps of each exercise, 2 45 second holds of CTAR -with mod I. Kinzee is consistently completing HEP at home. Hoarseness persists - I have deferred voice exercises as Sydnei is unable to  maintain clear phonation outside of therapy for home practice.  She is following swallow precautions with mod I. D/C ST at this time. She is aware she can return to laryngologist for possible vocal fold injections  07/23/24: [name] did get gummy MVI and gummy calcium  supplements for ease of swallowing. Targeted HEP for dysphagia - Gianah demonstrates effortful swallow and Masako accurately 20/20x. [name] required frequent max A to complete 12/20 trials - initially required consistent max modeling, instruction and using hand on larynx for feedback of laryngeal elevation during swallow. She benefits from cues to do the swallow then verbal cues to hold when elevation achieved. . Tongue press - 20/20 with rare min A - CTAR with 45 second hold 2/2x, CTAR 20 pulses with rare min A after clarification.   07/06/24: I couldn't get the tissue to blow this morning, but I could do it yesterday re: stretch flow. Today with stretch flow, Olinda achieved clear voice with rare min A. Yawn sigh progressing to sigh and cv syllables with clear phonation which becomes hoarse with subsequent repetitions. Trials of resonant voice, vocal function exercises and high intensity voice exercises also have not achieved clear phonation with max A. Zurri achieves clear phonation in ST but has been unable to carry this over to home exercises. At this time, we agree to defer voice therapy. Kody has been reporting improved swallowing, however this week she got strangled twice. She completed HEP for dysphagia today with usual mod A - will see her 1-2 more times for training in swallow HEP.  07/01/24: [name] enters with hoarse voice. She reports she achieved clear phonation on flow syllables yesterday, however was unable to achieve clear voice today at home. Targeted yawn sigh, easy onset and stretch flow to achieve clear voice - progressed to 3 cv flow syllables with clear phonation maintained. Short flow sentences clear phonation, however longer  sentences and 4 cv syllables result in return to harsh strained voice. She requires frequent mod A to ID hoarse strained voice and to stop repeating phrases with hoarse voice, and to go back to yawn sigh or stretch flow to reset clear phonation.   9/22/25[name] [name] enters with hoarse strained voice. Yawn sigh progressing to 1 syllable h words with clear phonation, 2 syllable h words with clear phonation 50% - Melizza continues to require frequent mod A to ID when voice becomes harsh and to stop repeating words and go back to yawn sigh. She requires ongoing education and modeling for being mindful of how she is using her voice, listen to her voice and manipulate her larynx to achieve clear voice. She is reading flow sentences and syllables in HEP with harsh voice and no attempt to correct or change the way she is using her voice. This week, she is to only practice syllables and words that she can maintain clear phonation and when she can't maintain clear phonation, she is to stop and go back to syllables that she can produce clearly.   06/24/24: Significant hoarseness persists - Olivya requires ongoing training to complete HEP mindfully listening  to and correcting her voice and using flow phonation rather than just reading and sustaining vowels without using strategies. Today, yawn-sigh completed with clear phonation with occasional min A, progressed to just sigh with usual mod A to correct hoarse voice and use of sigh with single and 2 syllable h words. Again Naidelin able to achieve clear phonation for a few repetitions, however she reverts back to hoarse voice and pressed speech. Cues to ID when voice is hoarse and correct this - if unable to correct her voice, we returned to yawn-sigh repetitions to re-set clear voice. Francies frequently requires cues to use the sigh strategy rather than just reading the words. HEP modified to yawn-sigh and use of sigh with 1-2 syllable h words  06/22/24: Dala has not completed SOVTE  due to having difficulty' with this. Today, SOVTE in water required occasional min A with glide down, and verbal cues to reduce tension in face, neck and shoulders. Strain continues with SOVTE. Attempted gargle to reduce laryngeal tension - Magalie demonstrated gargle 2/4 attempts - instructed her to gargle 2 minutes prior to exercises. Flow syllables using hand to feel air flow for biofeedback completed with 60% clear phonation - attempted resonant knoll with pitch changes and hum with cv syllable cueing forward phonation and feeling sound in her lips and nose, again with 60% clear phonation. Flow phonation phrases with usual mod cues to focus on and slightly exaggerate flow sounds improve vocal quality, however Siennah continues to require max A to carryover into structured tasks generating sentences and in conversation.   06/16/24: Pt with questions re: MBSS - reviewed results and recommendations. Kristl reports that she has been paying extra attention when she is eating and drinking, ensuring small well chewed bites and focusing on swallowing liquids - she has had only 1 episode of getting strangled since MBSS. She enters with harsh, strained voice. SOVTE in water completed with rare min A except glides required ongoing modeling and verbal cues to stop at the top pitch, take a breath before gliding down. Glide down frequently strained - she benefits from verbal cues to use relaxed throat, face and shoulders due to overt tension. Initiated relaxed, gentle high intensity sustained vowel and glide, starting with /h/ to encourage flow phonation - clear phonation achieved with sustained ha 8/10x for 4 second duration and verbal cues to ID and correct strained voice. Glides achieved clear phonation 6/10x, again with ha to reduce laryngeal tension. Flow phonation with tissue for feedback of airflow vs pressed voice - Makana required frequent verbal cues to reduce facial, neck and shoulder tension due to MTD. In flow  phrases, clear phonation achieved 50% of words - she also required cues to speak in her natural pitch as she was using higher sing song pitch initially. HEP is SOVTE in water, followed by flow phonation syllables with tissue, then high intensity sustained ha and glides with ha, as well as 5 flow phrases which she achieved clear phonation on in therapy.   04/27/24: Evaluation completed - trailed stretch flow phonation with tissue for feedback , resonant exercises with lingual protrusion, hums and forward focus phonation without improving voice. She required max A to coordinate exhalation and phonation. Straw phonation with hand for feedback of air flow with max A to coordinate exhalation and phonation - she could blow or use strained voice but not coordinate this together. With straw in water, hum bubbles were successful with frequent modeling and instruction to maintain steady phonation for 5-6 seconds.  With frequent mod to max A - trained her in HEP of SOVTE in water including hums, glides, accents and song - she was able to achieve steady phonation with bubbles. She sees laryngologist in 2 weeks - will adjust HEP as needed after this consult. Reflux education initiated.   PATIENT EDUCATION: Education details: See Treatment and Patient instructions, HEP for voice, vocal hygiene, reflux precautions Person educated: Patient Education method: Explanation, Demonstration, Verbal cues, and Handouts Education comprehension: verbal cues required and needs further education  HOME EXERCISE PROGRAM: SOVTE in water, resonant voice, flow phonation, vocal function exercises  GOALS: Goals reviewed with patient? Yes  SHORT TERM GOALS: Target date: 06/17/24 - extended due to scheduling  Pt will complete HEP for voice with occasional min A Baseline: Goal status: MET  2.  Pt will achieve clear phonation 15/20 sentences Baseline:  Goal status: NOT MET  3.  Pt will maintain clear phonation over 5 minute  conversation with usual mod A Baseline:  Goal status: NOT MET  4.  Pt will follow 3 vocal hygiene strategies Baseline:  Goal status: MET  5.  Pt will follow swallow precautions and reflux precautions with occasional min A Baseline:  Goal status: MET    LONG TERM GOALS: Target date: 07/20/14  Pt will complete HEP for voice with rare min A Baseline:  Goal status: NOT MET  2.  Pt will maintain clear phonation over 15 minute conversation Baseline:  Goal status: NOT MET  3.  Pt will improve score on VRQOL Baseline: 23 Goal status: NOT MET  4.  Pt will follow swallow precaution and diet modifications ifi MBSS is indicated Baseline:  Goal status: MET  5. Pt will complete HEP for dysphagia with occasional min A              Goal Status: MET   ASSESSMENT:  CLINICAL IMPRESSION: Patient is a 89 y.o. female who was seen today for significant dysphonia characterized by hoarse, rough, strained voice. With usual mod modeling and cueing, Kathleene achieves clear phonation with stretch flow, yawn sigh, sigh and short flow phrases in ST. Resonant voice, vocal function exercises, high intensity voice exercises did not achieve clear phonation with max A. Lavilla has been unable to carryover stretch flow, yawn sigh and flow syllables at home with clear voice, despite using tissue and feeling air flow on her hand as feedback. At this time, we have agreed to defer further voice therapy. Asami is completing HEP for dysphagia with mod I.  She is following swallow precautions and diet modifications with mod I. D/C ST at this time  OBJECTIVE IMPAIRMENTS: include voice disorder and dysphagia. These impairments are limiting patient from effectively communicating at home and in community and safety when swallowing. Factors affecting potential to achieve goals and functional outcome are co-morbidities.. Patient will benefit from skilled SLP services to address above impairments and improve overall  function.  REHAB POTENTIAL: Good  PLAN:  SLP FREQUENCY: 1-2x/week  SLP DURATION: 12 weeks  PLANNED INTERVENTIONS: Aspiration precaution training, Pharyngeal strengthening exercises, Diet toleration management , Environmental controls, Trials of upgraded texture/liquids, Cueing hierachy, Internal/external aids, Functional tasks, Multimodal communication approach, SLP instruction and feedback, Compensatory strategies, Patient/family education, and [id number] Treatment of speech (30 or 45 min) , MBSS   SPEECH THERAPY DISCHARGE SUMMARY  Visits from Start of Care: 9  Current functional level related to goals / functional outcomes: See goals above   Remaining deficits: Moderate dysphonia; mild oropharyngeal dysphagia   Education /  Equipment: HEP for voice, HEP for dysphagia; vocal hygiene, swallow precautions   Patient agrees to discharge. Patient goals were partially met. Patient is being discharged due to meeting dysphagia goals..     Burdell Peed Ann, CCC-SLP 07/15/2024, 2:33 PM

## 2024-07-16 DIAGNOSIS — E039 Hypothyroidism, unspecified: Secondary | ICD-10-CM | POA: Diagnosis not present

## 2024-07-16 DIAGNOSIS — E785 Hyperlipidemia, unspecified: Secondary | ICD-10-CM | POA: Diagnosis not present

## 2024-07-16 DIAGNOSIS — M810 Age-related osteoporosis without current pathological fracture: Secondary | ICD-10-CM | POA: Diagnosis not present

## 2024-07-20 ENCOUNTER — Encounter: Admitting: Speech Pathology

## 2024-07-22 ENCOUNTER — Encounter: Admitting: Speech Pathology

## 2024-07-23 DIAGNOSIS — J439 Emphysema, unspecified: Secondary | ICD-10-CM | POA: Diagnosis not present

## 2024-07-23 DIAGNOSIS — Z23 Encounter for immunization: Secondary | ICD-10-CM | POA: Diagnosis not present

## 2024-07-23 DIAGNOSIS — R82998 Other abnormal findings in urine: Secondary | ICD-10-CM | POA: Diagnosis not present

## 2024-07-23 DIAGNOSIS — Z0000 Encounter for general adult medical examination without abnormal findings: Secondary | ICD-10-CM | POA: Diagnosis not present

## 2024-07-23 DIAGNOSIS — Z1331 Encounter for screening for depression: Secondary | ICD-10-CM | POA: Diagnosis not present

## 2024-07-23 DIAGNOSIS — Z1339 Encounter for screening examination for other mental health and behavioral disorders: Secondary | ICD-10-CM | POA: Diagnosis not present

## 2024-08-04 DIAGNOSIS — H1789 Other corneal scars and opacities: Secondary | ICD-10-CM | POA: Diagnosis not present

## 2024-08-04 DIAGNOSIS — H2513 Age-related nuclear cataract, bilateral: Secondary | ICD-10-CM | POA: Diagnosis not present

## 2024-08-12 DIAGNOSIS — Z1231 Encounter for screening mammogram for malignant neoplasm of breast: Secondary | ICD-10-CM | POA: Diagnosis not present

## 2024-08-26 DIAGNOSIS — K080 Exfoliation of teeth due to systemic causes: Secondary | ICD-10-CM | POA: Diagnosis not present

## 2024-09-01 DIAGNOSIS — K080 Exfoliation of teeth due to systemic causes: Secondary | ICD-10-CM | POA: Diagnosis not present

## 2024-09-10 DIAGNOSIS — H2512 Age-related nuclear cataract, left eye: Secondary | ICD-10-CM | POA: Diagnosis not present

## 2024-09-10 DIAGNOSIS — H25812 Combined forms of age-related cataract, left eye: Secondary | ICD-10-CM | POA: Diagnosis not present

## 2024-09-10 DIAGNOSIS — H21562 Pupillary abnormality, left eye: Secondary | ICD-10-CM | POA: Diagnosis not present

## 2024-09-17 DIAGNOSIS — H2511 Age-related nuclear cataract, right eye: Secondary | ICD-10-CM | POA: Diagnosis not present

## 2024-09-17 DIAGNOSIS — H25811 Combined forms of age-related cataract, right eye: Secondary | ICD-10-CM | POA: Diagnosis not present

## 2024-09-23 DIAGNOSIS — K080 Exfoliation of teeth due to systemic causes: Secondary | ICD-10-CM | POA: Diagnosis not present
